# Patient Record
Sex: Female | Born: 1995 | Race: Black or African American | Hispanic: No | Marital: Single | Smoking: Never smoker
Health system: Southern US, Community
[De-identification: ages and names within clinical notes are randomized; demographics above are authoritative.]

## PROBLEM LIST (undated history)

## (undated) HISTORY — PX: WISDOM TOOTH EXTRACTION: SHX21

---
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
## 2017-01-22 ENCOUNTER — Ambulatory Visit: Payer: Medicaid Other | Admitting: Obstetrics and Gynecology

## 2017-05-27 ENCOUNTER — Emergency Department (HOSPITAL_COMMUNITY): Payer: Medicaid Other

## 2017-05-27 ENCOUNTER — Encounter (HOSPITAL_COMMUNITY): Payer: Self-pay

## 2017-05-27 ENCOUNTER — Emergency Department (HOSPITAL_COMMUNITY)
Admission: EM | Admit: 2017-05-27 | Discharge: 2017-05-27 | Disposition: A | Discharge status: Home / Self Care | Payer: Medicaid Other | Attending: Emergency Medicine | Admitting: Emergency Medicine

## 2017-05-27 DIAGNOSIS — R0789 Other chest pain: Secondary | ICD-10-CM | POA: Diagnosis present

## 2017-05-27 LAB — POC URINE PREG, ED: Preg Test, Ur: NEGATIVE

## 2017-05-27 LAB — CBC WITH DIFFERENTIAL/PLATELET
BASOS PCT: 0 %
Basophils Absolute: 0 10*3/uL (ref 0.0–0.1)
Eosinophils Absolute: 0.1 10*3/uL (ref 0.0–0.7)
Eosinophils Relative: 1 %
HEMATOCRIT: 35 % — AB (ref 36.0–46.0)
HEMOGLOBIN: 11.5 g/dL — AB (ref 12.0–15.0)
LYMPHS ABS: 2.2 10*3/uL (ref 0.7–4.0)
Lymphocytes Relative: 18 %
MCH: 30.7 pg (ref 26.0–34.0)
MCHC: 32.9 g/dL (ref 30.0–36.0)
MCV: 93.3 fL (ref 78.0–100.0)
MONOS PCT: 9 %
Monocytes Absolute: 1.1 10*3/uL — ABNORMAL HIGH (ref 0.1–1.0)
NEUTROS ABS: 8.7 10*3/uL — AB (ref 1.7–7.7)
NEUTROS PCT: 72 %
Platelets: 337 10*3/uL (ref 150–400)
RBC: 3.75 MIL/uL — AB (ref 3.87–5.11)
RDW: 12.5 % (ref 11.5–15.5)
WBC: 12.1 10*3/uL — AB (ref 4.0–10.5)

## 2017-05-27 LAB — BASIC METABOLIC PANEL
ANION GAP: 7 (ref 5–15)
BUN: 7 mg/dL (ref 6–20)
CALCIUM: 9.2 mg/dL (ref 8.9–10.3)
CHLORIDE: 104 mmol/L (ref 101–111)
CO2: 27 mmol/L (ref 22–32)
Creatinine, Ser: 0.71 mg/dL (ref 0.44–1.00)
GFR calc non Af Amer: >60 mL/min (ref >60)
Glucose, Bld: 109 mg/dL — ABNORMAL HIGH (ref 65–99)
Potassium: 3.8 mmol/L (ref 3.5–5.1)
SODIUM: 138 mmol/L (ref 135–145)

## 2017-05-27 LAB — D-DIMER, QUANTITATIVE (NOT AT ARMC): D DIMER QUANT: 1.24 ug{FEU}/mL — AB (ref 0.00–0.50)

## 2017-05-27 MED ORDER — IOPAMIDOL (ISOVUE-370) INJECTION 76%
100.0000 mL | Freq: Once | INTRAVENOUS | Status: AC | PRN
  Administered 2017-05-27: 80 mL via INTRAVENOUS

## 2017-05-27 MED ORDER — KETOROLAC TROMETHAMINE 30 MG/ML IJ SOLN
30.0000 mg | Freq: Once | INTRAMUSCULAR | Status: DC
  Filled 2017-05-27: qty 1

## 2017-05-27 MED ORDER — IOPAMIDOL (ISOVUE-370) INJECTION 76%
INTRAVENOUS | Status: AC
  Administered 2017-05-27: 80 mL via INTRAVENOUS
  Filled 2017-05-27: qty 100

## 2017-05-27 MED ORDER — IBUPROFEN 800 MG PO TABS
800.0000 mg | ORAL_TABLET | Freq: Once | ORAL | Status: AC
  Administered 2017-05-27: 800 mg via ORAL
  Filled 2017-05-27: qty 1

## 2017-05-27 NOTE — Discharge Instructions (Signed)
Ibuprofen 600 mg every 6 hours as needed for pain. ° °Return to the emergency department if you develop worsening pain, difficulty breathing, high fever, or other new and concerning symptoms. °

## 2017-05-27 NOTE — ED Notes (Signed)
Bed: WTR6 Expected date:  Expected time:  Means of arrival:  Comments: 

## 2017-05-27 NOTE — ED Provider Notes (Signed)
[facility]-EMERGENCY DEPT Provider Note   CSN: [id number] Arrival date & time: 05/27/17  0018     History   Chief Complaint Chief Complaint  Patient presents with  . Flank Pain    HPI Christine Howe is a 22 y.o. female.  Patient is a 22 year old female with no significant past medical history presenting for evaluation of right rib pain.  This started this evening after she finished work.  She reports pain to her right lateral ribs that is worse when she breathes, moves, or palpates the area.  She denies any productive cough.  She denies any fevers or chills.   The history is provided by the patient.  Chest Pain   This is a new problem. The current episode started 3 to 5 hours ago. The problem occurs constantly. The problem has not changed since onset.The pain is associated with movement, coughing and breathing. The pain is present in the lateral region. The pain is moderate. The quality of the pain is described as sharp. The pain does not radiate. The symptoms are aggravated by deep breathing and certain positions. She has tried nothing for the symptoms.    History reviewed. No pertinent past medical history.  There are no active problems to display for this patient.   Past Surgical History:  Procedure Laterality Date  . WISDOM TOOTH EXTRACTION      OB History    No data available       Home Medications    Prior to Admission medications   Medication Sig Start Date End Date Taking? Authorizing Provider  ibuprofen (ADVIL,MOTRIN) 800 MG tablet Take 1 tablet (800 mg total) by mouth 3 (three) times daily. Patient not taking: Reported on 05/27/2017 11/13/16   Gilda Crease, MD    Family History History reviewed. No pertinent family history.  Social History Social History   Tobacco Use  . Smoking status: Never Smoker  . Smokeless tobacco: Never Used  Substance Use Topics  . Alcohol use: No  . Drug use: No     Allergies   Patient  has no known allergies.   Review of Systems Review of Systems  Cardiovascular: Positive for chest pain.  All other systems reviewed and are negative.    Physical Exam Updated Vital Signs BP 124/90 (BP Location: Right Arm)   Pulse 83   Temp 100.1 F (37.8 C) (Oral)   Resp 20   LMP 05/20/2017   SpO2 100%   Physical Exam  Constitutional: She is oriented to person, place, and time. She appears well-developed and well-nourished. No distress.  HENT:  Head: Normocephalic and atraumatic.  Neck: Normal range of motion. Neck supple.  Cardiovascular: Normal rate and regular rhythm. Exam reveals no gallop and no friction rub.  No murmur heard. There is tenderness to palpation over the right lateral ribs.  This reproduces her symptoms.  Pulmonary/Chest: Effort normal and breath sounds normal. No respiratory distress. She has no wheezes.  Abdominal: Soft. Bowel sounds are normal. She exhibits no distension. There is no tenderness.  Musculoskeletal: Normal range of motion.  Neurological: She is alert and oriented to person, place, and time.  Skin: Skin is warm and dry. She is not diaphoretic.  Nursing note and vitals reviewed.    ED Treatments / Results  Labs (all labs ordered are listed, but only abnormal results are displayed) Labs Reviewed - No data to display  EKG  EKG Interpretation None       Radiology No results  found.  Procedures Procedures (including critical care time)  Medications Ordered in ED Medications  ketorolac (TORADOL) 30 MG/ML injection 30 mg (not administered)     Initial Impression / Assessment and Plan / ED Course  I have reviewed the triage vital signs and the nursing notes.  Pertinent labs & imaging results that were available during my care of the patient were reviewed by me and considered in my medical decision making (see chart for details).  Patient presenting here with complaints of sharp pain in the right side of her chest that began  after finishing work.  Her presentation and exam is most consistent with a musculoskeletal etiology, however she did have an elevated d-dimer.  A CT scan was obtained which reveals no evidence for pulmonary embolism or other abnormality.  I still believe her symptoms are musculoskeletal and will treat as such.  She will be given anti-inflammatories, advised to rest, and follow-up as needed.  Final Clinical Impressions(s) / ED Diagnoses   Final diagnoses:  None    ED Discharge Orders    None       Geoffery Lyonselo, Corinthian Mizrahi, MD 05/27/17 [id number]

## 2017-05-27 NOTE — ED Triage Notes (Signed)
Pt complains of right sided pain from her ribs to her clavicle for 2 hours, no injury

## 2017-11-08 ENCOUNTER — Emergency Department (HOSPITAL_COMMUNITY)
Admission: EM | Admit: 2017-11-08 | Discharge: 2017-11-08 | Disposition: A | Discharge status: Home / Self Care | Payer: Medicaid Other | Attending: Emergency Medicine | Admitting: Emergency Medicine

## 2017-11-08 ENCOUNTER — Encounter (HOSPITAL_COMMUNITY): Payer: Self-pay

## 2017-11-08 ENCOUNTER — Other Ambulatory Visit: Payer: Self-pay

## 2017-11-08 DIAGNOSIS — G43909 Migraine, unspecified, not intractable, without status migrainosus: Secondary | ICD-10-CM | POA: Diagnosis present

## 2017-11-08 DIAGNOSIS — G43809 Other migraine, not intractable, without status migrainosus: Secondary | ICD-10-CM | POA: Insufficient documentation

## 2017-11-08 MED ORDER — ONDANSETRON 4 MG PO TBDP: 4.0000 mg | ORAL_TABLET | Freq: Three times a day (TID) | ORAL | 0 refills | Status: AC | PRN

## 2017-11-08 MED ORDER — DIPHENHYDRAMINE HCL 50 MG/ML IJ SOLN
25.0000 mg | Freq: Once | INTRAMUSCULAR | Status: AC
  Administered 2017-11-08: 25 mg via INTRAVENOUS
  Filled 2017-11-08: qty 1

## 2017-11-08 MED ORDER — PROCHLORPERAZINE EDISYLATE 10 MG/2ML IJ SOLN
10.0000 mg | Freq: Once | INTRAMUSCULAR | Status: AC
  Administered 2017-11-08: 10 mg via INTRAVENOUS
  Filled 2017-11-08: qty 2

## 2017-11-08 MED ORDER — OXYCODONE-ACETAMINOPHEN 5-325 MG PO TABS
1.0000 | ORAL_TABLET | ORAL | Status: DC | PRN
  Administered 2017-11-08: 1 via ORAL
  Filled 2017-11-08: qty 1

## 2017-11-08 MED ORDER — SODIUM CHLORIDE 0.9 % IV BOLUS
1000.0000 mL | Freq: Once | INTRAVENOUS | Status: AC
  Administered 2017-11-08: 1000 mL via INTRAVENOUS

## 2017-11-08 NOTE — ED Triage Notes (Signed)
Pt presents for evaluation of migraine since Thursday. Pt reports unable to sleep, no relief from pain despite excedrin, ibuprofen, and tylenol. Patient reports hx of similar. Denies vision changes. Photosensitivity reported.

## 2017-11-08 NOTE — ED Notes (Signed)
No answer

## 2017-11-08 NOTE — ED Provider Notes (Signed)
[facility] EMERGENCY DEPARTMENT Provider Note   CSN: [id number] Arrival date & time: 11/08/17  1143     History   Chief Complaint Chief Complaint  Patient presents with  . Migraine    HPI Christine Howe is a 22 y.o. female with a past medical history of migraines, who presents to ED for evaluation of migraine for the past 3 days.  States that it started as a headache.  Her migraines usually last about 5 minutes.  She took 1 dose of ibuprofen when the symptoms began.  She reports associated nausea, photophobia, phonophobia.  Denies any head injuries, numbness in arms or legs, vision changes, neck pain, fever, abdominal pain, changes to gait.  HPI  History reviewed. No pertinent past medical history.  There are no active problems to display for this patient.   Past Surgical History:  Procedure Laterality Date  . WISDOM TOOTH EXTRACTION       OB History   None      Home Medications    Prior to Admission medications   Medication Sig Start Date End Date Taking? Authorizing Provider  ibuprofen (ADVIL,MOTRIN) 800 MG tablet Take 1 tablet (800 mg total) by mouth 3 (three) times daily. Patient not taking: Reported on 05/27/2017 11/13/16   Gilda Crease, MD  ondansetron (ZOFRAN ODT) 4 MG disintegrating tablet Take 1 tablet (4 mg total) by mouth every 8 (eight) hours as needed for nausea or vomiting. 11/08/17   Dietrich Pates, PA-C    Family History No family history on file.  Social History Social History   Tobacco Use  . Smoking status: Never Smoker  . Smokeless tobacco: Never Used  Substance Use Topics  . Alcohol use: No  . Drug use: No     Allergies   Patient has no known allergies.   Review of Systems Review of Systems  Constitutional: Negative for appetite change, chills and fever.  HENT: Negative for ear pain, rhinorrhea, sneezing and sore throat.   Eyes: Negative for photophobia and visual disturbance.  Respiratory: Negative for  cough, chest tightness, shortness of breath and wheezing.   Cardiovascular: Negative for chest pain and palpitations.  Gastrointestinal: Positive for nausea. Negative for abdominal pain, blood in stool, constipation, diarrhea and vomiting.  Genitourinary: Negative for dysuria, hematuria and urgency.  Musculoskeletal: Negative for myalgias.  Skin: Negative for rash.  Neurological: Positive for headaches. Negative for dizziness, weakness and light-headedness.     Physical Exam Updated Vital Signs BP 123/76 (BP Location: Right Arm)   Pulse 73   Temp 99.1 F (37.3 C) (Oral)   Resp 16   LMP 11/04/2017 (Approximate)   SpO2 99%   Physical Exam  Constitutional: She is oriented to person, place, and time. She appears well-developed and well-nourished. No distress.  HENT:  Head: Normocephalic and atraumatic.  Nose: Nose normal.  Eyes: Pupils are equal, round, and reactive to light. Conjunctivae and EOM are normal. Right eye exhibits no discharge. Left eye exhibits no discharge. No scleral icterus.  Neck: Normal range of motion. Neck supple.  Cardiovascular: Normal rate, regular rhythm, normal heart sounds and intact distal pulses. Exam reveals no gallop and no friction rub.  No murmur heard. Pulmonary/Chest: Effort normal and breath sounds normal. No respiratory distress.  Abdominal: Soft. Bowel sounds are normal. She exhibits no distension. There is no tenderness. There is no guarding.  Musculoskeletal: Normal range of motion. She exhibits no edema.  Neurological: She is alert and oriented to person, place, and  time. No cranial nerve deficit or sensory deficit. She exhibits normal muscle tone. Coordination normal.  Pupils reactive. No facial asymmetry noted. Cranial nerves appear grossly intact. Sensation intact to light touch on face, BUE and BLE. Strength 5/5 in BUE and BLE. Normal finger to nose coordination bilaterally.  Skin: Skin is warm and dry. No rash noted.  Psychiatric: She has a  normal mood and affect.  Nursing note and vitals reviewed.    ED Treatments / Results  Labs (all labs ordered are listed, but only abnormal results are displayed) Labs Reviewed - No data to display  EKG None  Radiology No results found.  Procedures Procedures (including critical care time)  Medications Ordered in ED Medications  oxyCODONE-acetaminophen (PERCOCET/ROXICET) 5-325 MG per tablet 1 tablet (1 tablet Oral Given 11/08/17 1215)  sodium chloride 0.9 % bolus 1,000 mL (0 mLs Intravenous Stopped 11/08/17 1429)  prochlorperazine (COMPAZINE) injection 10 mg (10 mg Intravenous Given 11/08/17 1323)  diphenhydrAMINE (BENADRYL) injection 25 mg (25 mg Intravenous Given 11/08/17 1323)     Initial Impression / Assessment and Plan / ED Course  I have reviewed the triage vital signs and the nursing notes.  Pertinent labs & imaging results that were available during my care of the patient were reviewed by me and considered in my medical decision making (see chart for details).  Clinical Course as of Nov 09 1442  Sun Nov 08, 2017  1442 Patient reports complete resolution of her symptoms with migraine cocktail given.   [HK]    Clinical Course User Index [HK] Dietrich PatesKhatri, Teandre Hamre, PA-C    Patient presents to ED for evaluation of migraine for the past 3 days.  Reports history of similar symptoms that usually resolve without medications or at the very least over-the-counter medications.  She has tried ibuprofen with no improvement in her symptoms. There are no headache characteristics that are lateralizing or concerning for increased ICP, infectious or vascular cause of her symptoms.  No deficits on neurological examination noted.  Symptoms appear consistent with a migraine headache.  Patient reports complete resolution of her symptoms with fluids, Benadryl and Compazine given here in the ED.  Advised to follow-up with PCP and to return to ED for any severe worsening symptoms.  Portions of this  note were generated with Scientist, clinical (histocompatibility and immunogenetics)Dragon dictation software. Dictation errors may occur despite best attempts at proofreading.  Final Clinical Impressions(s) / ED Diagnoses   Final diagnoses:  Other migraine without status migrainosus, not intractable    ED Discharge Orders        Ordered    ondansetron (ZOFRAN ODT) 4 MG disintegrating tablet  Every 8 hours PRN     11/08/17 1443       Dietrich PatesKhatri, Almee Pelphrey, PA-C 11/08/17 1445    Bethann BerkshireZammit, Joseph, MD 11/08/17 1620

## 2018-01-27 ENCOUNTER — Other Ambulatory Visit: Payer: Self-pay

## 2018-01-27 ENCOUNTER — Emergency Department (HOSPITAL_COMMUNITY): Payer: Self-pay

## 2018-01-27 ENCOUNTER — Encounter (HOSPITAL_COMMUNITY): Payer: Self-pay

## 2018-01-27 ENCOUNTER — Emergency Department (HOSPITAL_COMMUNITY)
Admission: EM | Admit: 2018-01-27 | Discharge: 2018-01-28 | Disposition: A | Discharge status: Home / Self Care | Payer: Self-pay | Attending: Emergency Medicine | Admitting: Emergency Medicine

## 2018-01-27 DIAGNOSIS — N939 Abnormal uterine and vaginal bleeding, unspecified: Secondary | ICD-10-CM | POA: Insufficient documentation

## 2018-01-27 LAB — CBC WITH DIFFERENTIAL/PLATELET
BASOS ABS: 0 10*3/uL (ref 0.0–0.1)
BASOS PCT: 0 %
EOS ABS: 0.2 10*3/uL (ref 0.0–0.7)
EOS PCT: 3 %
HCT: 39.2 % (ref 36.0–46.0)
Hemoglobin: 12.6 g/dL (ref 12.0–15.0)
Lymphocytes Relative: 44 %
Lymphs Abs: 3.3 10*3/uL (ref 0.7–4.0)
MCH: 30.2 pg (ref 26.0–34.0)
MCHC: 32.1 g/dL (ref 30.0–36.0)
MCV: 94 fL (ref 78.0–100.0)
MONO ABS: 0.4 10*3/uL (ref 0.1–1.0)
MONOS PCT: 6 %
Neutro Abs: 3.6 10*3/uL (ref 1.7–7.7)
Neutrophils Relative %: 47 %
PLATELETS: 380 10*3/uL (ref 150–400)
RBC: 4.17 MIL/uL (ref 3.87–5.11)
RDW: 12.6 % (ref 11.5–15.5)
WBC: 7.5 10*3/uL (ref 4.0–10.5)

## 2018-01-27 LAB — BASIC METABOLIC PANEL
ANION GAP: 9 (ref 5–15)
BUN: 9 mg/dL (ref 6–20)
CALCIUM: 9.7 mg/dL (ref 8.9–10.3)
CO2: 25 mmol/L (ref 22–32)
CREATININE: 0.87 mg/dL (ref 0.44–1.00)
Chloride: 109 mmol/L (ref 98–111)
Glucose, Bld: 90 mg/dL (ref 70–99)
Potassium: 4.1 mmol/L (ref 3.5–5.1)
Sodium: 143 mmol/L (ref 135–145)

## 2018-01-27 LAB — I-STAT BETA HCG BLOOD, ED (MC, WL, AP ONLY): I-stat hCG, quantitative: <5 m[IU]/mL (ref <5)

## 2018-01-27 MED ORDER — ONDANSETRON HCL 4 MG/2ML IJ SOLN
4.0000 mg | Freq: Once | INTRAMUSCULAR | Status: AC
  Administered 2018-01-27: 4 mg via INTRAVENOUS
  Filled 2018-01-27: qty 2

## 2018-01-27 MED ORDER — MORPHINE SULFATE (PF) 4 MG/ML IV SOLN
4.0000 mg | Freq: Once | INTRAVENOUS | Status: AC
  Administered 2018-01-27: 4 mg via INTRAVENOUS
  Filled 2018-01-27: qty 1

## 2018-01-27 MED ORDER — SODIUM CHLORIDE 0.9 % IV BOLUS
1000.0000 mL | Freq: Once | INTRAVENOUS | Status: AC
  Administered 2018-01-27: 1000 mL via INTRAVENOUS

## 2018-01-27 MED ORDER — AZITHROMYCIN 250 MG PO TABS
1000.0000 mg | ORAL_TABLET | Freq: Once | ORAL | Status: AC
  Administered 2018-01-28: 1000 mg via ORAL
  Filled 2018-01-27: qty 4

## 2018-01-27 MED ORDER — CEFTRIAXONE SODIUM 250 MG IJ SOLR
250.0000 mg | Freq: Once | INTRAMUSCULAR | Status: AC
  Administered 2018-01-28: 250 mg via INTRAMUSCULAR
  Filled 2018-01-27: qty 250

## 2018-01-27 NOTE — Discharge Instructions (Addendum)
Evaluated today for vaginal bleeding and STD screening.  Your lab work was negative for any acute findings.  Your CT scan did show that you had cyst on your ovaries.  I will have you follow-up with OB/GYN for your recurrent vaginal bleeding.  Please follow-up in the next 2 days.    You have been tested for gonorrhea, chlamydia, HIV, syphilis.  Your results was a 24-48 hrs. to come back.  You have been treated prophylactically for gonorrhea and chlamydia with azithromycin and Rocephin while in the emergency department.  If your results are positive you need to require sexual partners that they will need to be tested and treated as well.  Please do not resume sexual intercourse until 1 week after your partner has been treated.    Please return to the ED with any new or worsening symptoms such as:  Get help right away if: Your pain does not go away as soon as your doctor says it should. You cannot stop throwing up. Your pain is only in areas of your belly, such as the right side or the left lower part of the belly. You have bloody or black poop, or poop that looks like tar. You have very bad pain, cramping, or bloating in your belly. You have signs of not having enough fluid or water in your body (dehydration), such as: Dark pee, very little pee, or no pee. Cracked lips. Dry mouth. Sunken eyes. Sleepiness. Weakness.

## 2018-01-27 NOTE — ED Notes (Signed)
US at bedside

## 2018-01-27 NOTE — ED Provider Notes (Signed)
[facility]-EMERGENCY DEPT Provider Note   CSN: [id number] Arrival date & time: 01/27/18  1808   History   Chief Complaint Chief Complaint  Patient presents with  . Vaginal Bleeding    HPI Christine Howe is a 22 y.o. female no significant medical history who presents for evaluation of vaginal bleeding.  Patient states she had her normal cycle which last 7 days and it had tapered off, however 1 day later she started with her cycle again.  Last 2 days she has had spotting. Admits to intermittent lower abdominal cramping.  States she is sexually active and uses condoms as protection. Denies fever, chills, lightheadedness, dizziness, syncope, diarrhea, constipation, vaginal discharge, dysuria, hematuria. States she would like STD testing during her visit today.  HPI  History reviewed. No pertinent past medical history.  There are no active problems to display for this patient.   Past Surgical History:  Procedure Laterality Date  . WISDOM TOOTH EXTRACTION       OB History   None      Home Medications    Prior to Admission medications   Medication Sig Start Date End Date Taking? Authorizing Provider  ibuprofen (ADVIL,MOTRIN) 800 MG tablet Take 1 tablet (800 mg total) by mouth 3 (three) times daily. Patient not taking: Reported on 05/27/2017 11/13/16   Gilda Crease, MD  ondansetron (ZOFRAN ODT) 4 MG disintegrating tablet Take 1 tablet (4 mg total) by mouth every 8 (eight) hours as needed for nausea or vomiting. Patient not taking: Reported on 01/27/2018 11/08/17   Dietrich Pates, PA-C    Family History No family history on file.  Social History Social History   Tobacco Use  . Smoking status: Never Smoker  . Smokeless tobacco: Never Used  Substance Use Topics  . Alcohol use: No  . Drug use: No     Allergies   Patient has no known allergies.   Review of Systems Review of Systems  Constitutional: Negative for activity change, chills,  diaphoresis, fatigue and fever.  Gastrointestinal: Positive for abdominal pain and nausea. Negative for blood in stool, constipation, diarrhea and vomiting.  Genitourinary: Positive for menstrual problem and vaginal bleeding. Negative for decreased urine volume, difficulty urinating, dysuria, flank pain, frequency, genital sores, hematuria, pelvic pain, urgency, vaginal discharge and vaginal pain.  Musculoskeletal: Negative for back pain.  Skin: Negative.   Neurological: Negative for dizziness, weakness and light-headedness.     Physical Exam Updated Vital Signs BP 128/73 (BP Location: Left Arm)   Pulse 79   Temp 98.8 F (37.1 C) (Oral)   Resp 16   Ht 5\' 3"  (1.6 m)   Wt 62.8 kg   LMP 01/27/2018   SpO2 100%   BMI 24.52 kg/m   Physical Exam  Constitutional: She appears well-developed and well-nourished. No distress.  HENT:  Head: Normocephalic and atraumatic.  Mouth/Throat: Oropharynx is clear and moist.  Eyes: Pupils are equal, round, and reactive to light. Conjunctivae are normal.  Neck: Normal range of motion. Neck supple.  Cardiovascular: Normal rate, regular rhythm and intact distal pulses. Exam reveals no friction rub.  No murmur heard. Pulmonary/Chest: Effort normal and breath sounds normal. No stridor. No respiratory distress. She has no wheezes. She has no rales. She exhibits no tenderness.  Abdominal: Soft. Bowel sounds are normal. She exhibits no distension, no fluid wave, no ascites and no mass. There is no hepatosplenomegaly. There is tenderness in the suprapubic area. There is no rigidity, no rebound, no guarding, no  CVA tenderness, no tenderness at McBurney's point and negative Murphy's sign.  Genitourinary:  Genitourinary Comments: Normal appearing external female genitalia without rashes or lesions, normal vaginal epithelium. Normal appearing cervix without discharge or petechiae. Cervical os is closed. There is no active bleeding noted at the os. NoOdor. Bimanual:  No CMTnontender.  No palpable adnexal masses or tenderness. Uterus midline and not fixed.  No cystocele or rectocele noted. No pelvic lymphadenopathy noted. Wet prep was obtained.  Cultures for gonorrhea and chlamydia collected. Exam performed with chaperone in room.  Musculoskeletal: Normal range of motion.  Neurological: She is alert.  Skin: Skin is warm and dry. She is not diaphoretic.  Psychiatric: She has a normal mood and affect.  Nursing note and vitals reviewed.    ED Treatments / Results  Labs (all labs ordered are listed, but only abnormal results are displayed) Labs Reviewed  CBC WITH DIFFERENTIAL/PLATELET  BASIC METABOLIC PANEL  HIV ANTIBODY (ROUTINE TESTING W REFLEX)  RPR  I-STAT BETA HCG BLOOD, ED ([facility], [facility], [facility] ONLY)  GC/CHLAMYDIA PROBE AMP ([facility]) NOT AT [facility]    EKG None  Radiology US Transvaginal Non-ob  Result Date: 01/27/2018 CLINICAL DATA:  Initial evaluation for old acute pelvic pain with vaginal bleeding for 2 weeks. EXAM: TRANSABDOMINAL AND TRANSVAGINAL ULTRASOUND OF PELVIS TECHNIQUE: Both transabdominal and transvaginal ultrasound examinations of the pelvis were performed. Transabdominal technique was performed for global imaging of the pelvis including uterus, ovaries, adnexal regions, and pelvic cul-de-sac. It was necessary to proceed with endovaginal exam following the transabdominal exam to visualize the uterus, endometrium, and ovaries. COMPARISON:  None FINDINGS: Uterus Measurements: 7.6 x 3.3 x 4.5 cm. No fibroids or other mass visualized. Endometrium Thickness: 11 mm.  No focal abnormality visualized. Right ovary Measurements: 0.3 x 2.2 x 2.6 cm. Normal appearance/no adnexal mass. Left ovary Measurements: 3.6 x 2.3 x 2.7 cm. 1.7 x 1.4 x 1.7 cm simple cyst, most consistent with a normal physiologic follicular cyst/dominant follicle. Other findings Small volume free physiologic fluid within the pelvis. IMPRESSION: 1. 1.7 cm simple left ovarian cyst, most  consistent with a normal physiologic follicular cyst/dominant follicle. Associated small volume free physiologic fluid within the pelvis. 2. Otherwise unremarkable and normal pelvic ultrasound. Electronically Signed   By: Rise Mu M.D.   On: 01/27/2018 23:19   US Pelvis Complete  Result Date: 01/27/2018 CLINICAL DATA:  Initial evaluation for old acute pelvic pain with vaginal bleeding for 2 weeks. EXAM: TRANSABDOMINAL AND TRANSVAGINAL ULTRASOUND OF PELVIS TECHNIQUE: Both transabdominal and transvaginal ultrasound examinations of the pelvis were performed. Transabdominal technique was performed for global imaging of the pelvis including uterus, ovaries, adnexal regions, and pelvic cul-de-sac. It was necessary to proceed with endovaginal exam following the transabdominal exam to visualize the uterus, endometrium, and ovaries. COMPARISON:  None FINDINGS: Uterus Measurements: 7.6 x 3.3 x 4.5 cm. No fibroids or other mass visualized. Endometrium Thickness: 11 mm.  No focal abnormality visualized. Right ovary Measurements: 0.3 x 2.2 x 2.6 cm. Normal appearance/no adnexal mass. Left ovary Measurements: 3.6 x 2.3 x 2.7 cm. 1.7 x 1.4 x 1.7 cm simple cyst, most consistent with a normal physiologic follicular cyst/dominant follicle. Other findings Small volume free physiologic fluid within the pelvis. IMPRESSION: 1. 1.7 cm simple left ovarian cyst, most consistent with a normal physiologic follicular cyst/dominant follicle. Associated small volume free physiologic fluid within the pelvis. 2. Otherwise unremarkable and normal pelvic ultrasound. Electronically Signed   By: Rise Mu M.D.   On: 01/27/2018  23:19    Procedures Procedures (including critical care time)  Medications Ordered in ED Medications  azithromycin (ZITHROMAX) tablet 1,000 mg (has no administration in time range)  cefTRIAXone (ROCEPHIN) injection 250 mg (has no administration in time range)  sodium chloride 0.9 % bolus  1,000 mL (1,000 mLs Intravenous New Bag/Given 01/27/18 2226)  morphine 4 MG/ML injection 4 mg (4 mg Intravenous Given 01/27/18 2226)  ondansetron (ZOFRAN) injection 4 mg (4 mg Intravenous Given 01/27/18 2226)     Initial Impression / Assessment and Plan / ED Course  I have reviewed the triage vital signs and the nursing notes as well as past medical history.  Pertinent labs & imaging results that were available during my care of the patient were reviewed by me and considered in my medical decision making (see chart for details).  22 year old otherwise healthy female presents for 2 weeks of intermittent vaginal bleeding.  Mild suprapubic tenderness to palpation.  Afebrile, nonseptic, non-ill-appearing.  No rebound or guarding on exam.  Will obtain labs, ultrasound, pelvic examand reassess. Patient is also requesting STD screening at this time.  CBC without leukocytosis, hemoglobin 12.6.  hCG negative. US with a left ovarian cyst.  No cervical motion tenderness on exam.  No active bleeding from cervix. Patient does not meet the SIRS or Sepsis criteria.  On repeat exam patient does not have a surgical abdomin and there are no peritoneal signs.  No indication of appendicitis, bowel obstruction, bowel perforation, cholecystitis, diverticulitis, PID or ectopic pregnancy.    Pt understands that they have GC/Chlamydia cultures pending and that they will need to inform all sexual partners if results return positive. Pt has been treated prophylactically with azithromycin and Rocephin due to pts history, pelvic exam. Pt not concerning for PID because hemodynamically stable and no cervical motion tenderness on pelvic exam. Patient to be discharged with instructions to follow up with OBGYN. Discussed importance of using protection when sexually active. Strict return precautions given. Patient expresses understanding and agrees with plan.  Discussed to follow-up with OB/GYN.  She is agreeable for follow-up.   Final  Clinical Impressions(s) / ED Diagnoses   Final diagnoses:  Abnormal uterine bleeding (AUB)    ED Discharge Orders    None       Scottie Metayer A, PA-C 01/28/18 0000    Terrilee FilesButler, Michael C, MD 01/28/18 1209

## 2018-01-27 NOTE — ED Triage Notes (Signed)
Pt states she has had vaginal bleeding x 14 days. Pt states she has been going through 5 per day. Pt states however today, she has only been spotting.

## 2018-01-28 LAB — GC/CHLAMYDIA PROBE AMP (~~LOC~~) NOT AT ARMC
CHLAMYDIA, DNA PROBE: POSITIVE — AB
NEISSERIA GONORRHEA: NEGATIVE

## 2018-01-28 LAB — RPR: RPR: NONREACTIVE

## 2018-01-28 LAB — HIV ANTIBODY (ROUTINE TESTING W REFLEX): HIV Screen 4th Generation wRfx: NONREACTIVE

## 2018-01-28 MED ORDER — LIDOCAINE HCL 1 % IJ SOLN
INTRAMUSCULAR | Status: AC
  Administered 2018-01-28: 0.9 mL
  Filled 2018-01-28: qty 20

## 2018-02-15 ENCOUNTER — Emergency Department (HOSPITAL_COMMUNITY)
Admission: EM | Admit: 2018-02-15 | Discharge: 2018-02-16 | Disposition: A | Discharge status: Home / Self Care | Payer: Self-pay | Attending: Emergency Medicine | Admitting: Emergency Medicine

## 2018-02-15 ENCOUNTER — Emergency Department (HOSPITAL_COMMUNITY): Payer: Self-pay

## 2018-02-15 ENCOUNTER — Encounter (HOSPITAL_COMMUNITY): Payer: Self-pay | Admitting: *Deleted

## 2018-02-15 ENCOUNTER — Other Ambulatory Visit: Payer: Self-pay

## 2018-02-15 DIAGNOSIS — R0600 Dyspnea, unspecified: Secondary | ICD-10-CM | POA: Insufficient documentation

## 2018-02-15 DIAGNOSIS — R0789 Other chest pain: Secondary | ICD-10-CM | POA: Insufficient documentation

## 2018-02-15 DIAGNOSIS — F1210 Cannabis abuse, uncomplicated: Secondary | ICD-10-CM | POA: Insufficient documentation

## 2018-02-15 NOTE — ED Provider Notes (Signed)
[facility]-EMERGENCY DEPT Provider Note   CSN: [id number] Arrival date & time: 02/15/18  2142     History   Chief Complaint Chest pain and shortness of breath  HPI Christine Howe is a 22 y.o. female.  HPI Patient presents to the emergency room for evaluation of chest pain and shortness of breath.  Patient states she felt well during the day but suddenly started to feel short of breath while she was smoking marijuana.  Patient also started to feel jittery.  It hurts for her to take a deep breath.  Patient denies any trouble with fevers or chills.  No coughing.  No vomiting or diarrhea.  She does not have any history of heart or lung disease.  History reviewed. No pertinent past medical history.  There are no active problems to display for this patient.   Past Surgical History:  Procedure Laterality Date  . WISDOM TOOTH EXTRACTION       OB History   None      Home Medications    Prior to Admission medications   Medication Sig Start Date End Date Taking? Authorizing Provider  acetaminophen (TYLENOL) 500 MG tablet Take 1,000 mg by mouth daily as needed for moderate pain.   Yes [provider]  ibuprofen (ADVIL,MOTRIN) 800 MG tablet Take 1 tablet (800 mg total) by mouth 3 (three) times daily. Patient not taking: Reported on 02/15/2018 11/13/16   Gilda Crease, MD  ondansetron (ZOFRAN ODT) 4 MG disintegrating tablet Take 1 tablet (4 mg total) by mouth every 8 (eight) hours as needed for nausea or vomiting. Patient not taking: Reported on 02/15/2018 11/08/17   Dietrich Pates, PA-C    Family History No family history on file.  Social History Social History   Tobacco Use  . Smoking status: Never Smoker  . Smokeless tobacco: Never Used  Substance Use Topics  . Alcohol use: No  . Drug use: No     Allergies   Patient has no known allergies.   Review of Systems Review of Systems  All other systems reviewed and are  negative.    Physical Exam Updated Vital Signs LMP 02/15/2018   Physical Exam  Constitutional: She appears well-developed and well-nourished. No distress.  HENT:  Head: Normocephalic and atraumatic.  Right Ear: External ear normal.  Left Ear: External ear normal.  Eyes: Conjunctivae are normal. Right eye exhibits no discharge. Left eye exhibits no discharge. No scleral icterus.  Neck: Neck supple. No tracheal deviation present.  Cardiovascular: Normal rate, regular rhythm and intact distal pulses.  Pulmonary/Chest: Effort normal and breath sounds normal. No stridor. No respiratory distress. She has no wheezes. She has no rales.  Abdominal: Soft. Bowel sounds are normal. She exhibits no distension. There is no tenderness. There is no rebound and no guarding.  Musculoskeletal: She exhibits no edema or tenderness.  Neurological: She is alert. She has normal strength. No cranial nerve deficit (no facial droop, extraocular movements intact, no slurred speech) or sensory deficit. She exhibits normal muscle tone. She displays no seizure activity. Coordination normal.  Skin: Skin is warm and dry. No rash noted.  Psychiatric: Her affect is blunt.  Nursing note and vitals reviewed.    ED Treatments / Results  Labs (all labs ordered are listed, but only abnormal results are displayed) Labs Reviewed - No data to display  EKG EKG Interpretation  Date/Time:  Monday February 15 2018 22:55:30 EDT Ventricular Rate:  82 PR Interval:    QRS  Duration: 82 QT Interval:  361 QTC Calculation: 422 R Axis:   73 Text Interpretation:  Sinus rhythm No significant change since last tracing Confirmed by Linwood Dibbles [phone]) on 02/15/2018 11:04:38 PM   Radiology Dg Chest 2 View  Result Date: 02/15/2018 CLINICAL DATA:  Dyspnea while smoking marijuana 1 hour ago. EXAM: CHEST - 2 VIEW COMPARISON:  CT 05/27/2017 and CXR 05/27/2017 FINDINGS: The heart size and mediastinal contours are within normal limits.  Both lungs are clear. The visualized skeletal structures are unremarkable. IMPRESSION: No active cardiopulmonary disease. Electronically Signed   By: Tollie Eth M.D.   On: 02/15/2018 22:57    Procedures Procedures (including critical care time)  Medications Ordered in ED Medications - No data to display   Initial Impression / Assessment and Plan / ED Course  I have reviewed the triage vital signs and the nursing notes.  Pertinent labs & imaging results that were available during my care of the patient were reviewed by me and considered in my medical decision making (see chart for details).   X-ray and EKG unremarkable.  Patient was monitored in the emergency room.  Symptoms have improved  I suspect her symptoms were related to the marijuana use.  No signs of acute cardiac event.  No signs of pneumonia or pneumothorax.  At this time there does not appear to be any evidence of an acute emergency medical condition and the patient appears stable for discharge with appropriate outpatient follow up.  Final Clinical Impressions(s) / ED Diagnoses   Final diagnoses:  Dyspnea, unspecified type    ED Discharge Orders    None       Linwood Dibbles, MD 02/15/18 2319

## 2018-02-15 NOTE — ED Triage Notes (Signed)
Pt arrives to ED with c/o shortness of breath that started while smoking marijuana 1 hour ago. Also has felt jittery. Lungs sounds clear throughout.

## 2018-02-15 NOTE — Discharge Instructions (Addendum)
Return to the emergency room as needed for worsening symptoms

## 2018-02-17 ENCOUNTER — Encounter: Payer: Medicaid Other | Admitting: Obstetrics and Gynecology

## 2018-02-17 ENCOUNTER — Encounter: Payer: Self-pay | Admitting: Obstetrics and Gynecology

## 2018-03-29 ENCOUNTER — Encounter: Payer: Self-pay | Admitting: *Deleted

## 2018-03-29 ENCOUNTER — Encounter: Payer: Self-pay | Admitting: Obstetrics & Gynecology

## 2018-03-29 ENCOUNTER — Ambulatory Visit: Payer: Medicaid Other | Admitting: Obstetrics & Gynecology

## 2018-03-29 ENCOUNTER — Other Ambulatory Visit (HOSPITAL_COMMUNITY)
Admission: RE | Admit: 2018-03-29 | Discharge: 2018-03-29 | Disposition: A | Discharge status: Home / Self Care | Payer: Medicaid Other | Source: Ambulatory Visit | Attending: Obstetrics & Gynecology | Admitting: Obstetrics & Gynecology

## 2018-03-29 VITALS — BP 119/76 | HR 72 | Ht 63.0 in | Wt 136.0 lb

## 2018-03-29 DIAGNOSIS — A749 Chlamydial infection, unspecified: Secondary | ICD-10-CM | POA: Insufficient documentation

## 2018-03-29 DIAGNOSIS — Z3202 Encounter for pregnancy test, result negative: Secondary | ICD-10-CM

## 2018-03-29 DIAGNOSIS — Z3049 Encounter for surveillance of other contraceptives: Secondary | ICD-10-CM | POA: Diagnosis not present

## 2018-03-29 DIAGNOSIS — Z8619 Personal history of other infectious and parasitic diseases: Secondary | ICD-10-CM | POA: Insufficient documentation

## 2018-03-29 DIAGNOSIS — Z30017 Encounter for initial prescription of implantable subdermal contraceptive: Secondary | ICD-10-CM | POA: Insufficient documentation

## 2018-03-29 LAB — POCT PREGNANCY, URINE: Preg Test, Ur: NEGATIVE

## 2018-03-29 MED ORDER — ETONOGESTREL 68 MG ~~LOC~~ IMPL
68.0000 mg | DRUG_IMPLANT | Freq: Once | SUBCUTANEOUS | Status: AC
  Administered 2018-03-29: 68 mg via SUBCUTANEOUS

## 2018-03-29 NOTE — Progress Notes (Signed)
GYNECOLOGY OFFICE PROCEDURE NOTE  Christine Howe is a 22 y.o. G0P0000 here for Nexplanon insertion. No other gynecologic concerns.  Nexplanon Insertion Procedure Patient identified, informed consent performed, consent signed.   Patient does understand that irregular bleeding is a very common side effect of this medication. She was advised to have backup contraception for one week after placement. Pregnancy test in clinic today was negative.  Appropriate time out taken.  Patient's rightt arm was prepped and draped in the usual sterile fashion. The ruler used to measure and mark insertion area.  Patient was prepped with alcohol swab and then injected with 3 ml of 1% lidocaine.  She was prepped with betadine, Nexplanon removed from packaging,  Device confirmed in needle, then inserted full length of needle and withdrawn per handbook instructions. Nexplanon was able to palpated in the patient's arm; patient palpated the insert herself. There was minimal blood loss.  Patient insertion site covered with gauze and a pressure bandage to reduce any bruising.  The patient tolerated the procedure well and was given post procedure instructions. Advised back-up contraception for one week  Adam PhenixArnold,  G, MD 03/29/2018        Adam PhenixArnold,  G, MD Attending Obstetrician & Gynecologist, [facility]  03/29/2018

## 2018-03-29 NOTE — Patient Instructions (Signed)
Etonogestrel implant What is this medicine? ETONOGESTREL (et oh noe JES trel) is a contraceptive (birth control) device. It is used to prevent pregnancy. It can be used for up to 3 years. This medicine may be used for other purposes; ask your health care provider or pharmacist if you have questions. COMMON BRAND NAME(S): Implanon, Nexplanon What should I tell my health care provider before I take this medicine? They need to know if you have any of these conditions: -abnormal vaginal bleeding -blood vessel disease or blood clots -cancer of the breast, cervix, or liver -depression -diabetes -gallbladder disease -headaches -heart disease or recent heart attack -high blood pressure -high cholesterol -kidney disease -liver disease -renal disease -seizures -tobacco smoker -an unusual or allergic reaction to etonogestrel, other hormones, anesthetics or antiseptics, medicines, foods, dyes, or preservatives -pregnant or trying to get pregnant -breast-feeding How should I use this medicine? This device is inserted just under the skin on the inner side of your upper arm by a health care professional. Talk to your pediatrician regarding the use of this medicine in children. Special care may be needed. Overdosage: If you think you have taken too much of this medicine contact a poison control center or emergency room at once. NOTE: This medicine is only for you. Do not share this medicine with others. What if I miss a dose? This does not apply. What may interact with this medicine? Do not take this medicine with any of the following medications: -amprenavir -bosentan -fosamprenavir This medicine may also interact with the following medications: -barbiturate medicines for inducing sleep or treating seizures -certain medicines for fungal infections like ketoconazole and itraconazole -grapefruit juice -griseofulvin -medicines to treat seizures like carbamazepine, felbamate, oxcarbazepine,  phenytoin, topiramate -modafinil -phenylbutazone -rifampin -rufinamide -some medicines to treat HIV infection like atazanavir, indinavir, lopinavir, nelfinavir, tipranavir, ritonavir -St. John's wort This list may not describe all possible interactions. Give your health care provider a list of all the medicines, herbs, non-prescription drugs, or dietary supplements you use. Also tell them if you smoke, drink alcohol, or use illegal drugs. Some items may interact with your medicine. What should I watch for while using this medicine? This product does not protect you against HIV infection (AIDS) or other sexually transmitted diseases. You should be able to feel the implant by pressing your fingertips over the skin where it was inserted. Contact your doctor if you cannot feel the implant, and use a non-hormonal birth control method (such as condoms) until your doctor confirms that the implant is in place. If you feel that the implant may have broken or become bent while in your arm, contact your healthcare provider. What side effects may I notice from receiving this medicine? Side effects that you should report to your doctor or health care professional as soon as possible: -allergic reactions like skin rash, itching or hives, swelling of the face, lips, or tongue -breast lumps -changes in emotions or moods -depressed mood -heavy or prolonged menstrual bleeding -pain, irritation, swelling, or bruising at the insertion site -scar at site of insertion -signs of infection at the insertion site such as fever, and skin redness, pain or discharge -signs of pregnancy -signs and symptoms of a blood clot such as breathing problems; changes in vision; chest pain; severe, sudden headache; pain, swelling, warmth in the leg; trouble speaking; sudden numbness or weakness of the face, arm or leg -signs and symptoms of liver injury like dark yellow or brown urine; general ill feeling or flu-like symptoms;  light-colored   stools; loss of appetite; nausea; right upper belly pain; unusually weak or tired; yellowing of the eyes or skin -unusual vaginal bleeding, discharge -signs and symptoms of a stroke like changes in vision; confusion; trouble speaking or understanding; severe headaches; sudden numbness or weakness of the face, arm or leg; trouble walking; dizziness; loss of balance or coordination Side effects that usually do not require medical attention (report to your doctor or health care professional if they continue or are bothersome): -acne -back pain -breast pain -changes in weight -dizziness -general ill feeling or flu-like symptoms -headache -irregular menstrual bleeding -nausea -sore throat -vaginal irritation or inflammation This list may not describe all possible side effects. Call your doctor for medical advice about side effects. You may report side effects to FDA at 1-800-FDA-1088. Where should I keep my medicine? This drug is given in a hospital or clinic and will not be stored at home. NOTE: This sheet is a summary. It may not cover all possible information. If you have questions about this medicine, talk to your doctor, pharmacist, or health care provider.  2018 Elsevier/Gold Standard (2015-11-15 11:19:22)  

## 2018-03-29 NOTE — Progress Notes (Signed)
Last unprotected intercourse 2 days ago.  Addendum: 4:14 - noted elevated phq9 while patient in office. Offered to see our behavioural health clinician- she declined

## 2018-03-29 NOTE — Progress Notes (Signed)
Patient ID: Christine Howe, female   DOB: Aug 05, 1995, 22 y.o.   MRN: [id number]  Chief Complaint  Patient presents with  . Ovarian Cyst  . nexplanon    HPI Christine Howe is a 22 y.o. female.  G0P0000 Patient's last menstrual period was 03/21/2018. She is still bleeding since LMP, has intercourse 2 days ago. She wants to have Nexplanon inserted. [place] was done 9/18 and ovarian follicle cyst was seen. S/p treatment for chlamydia and TOC is indicated today. HPI  History reviewed. No pertinent past medical history.  Past Surgical History:  Procedure Laterality Date  . WISDOM TOOTH EXTRACTION      History reviewed. No pertinent family history.  Social History Social History   Tobacco Use  . Smoking status: Never Smoker  . Smokeless tobacco: Never Used  Substance Use Topics  . Alcohol use: No  . Drug use: No    No Known Allergies  Current Outpatient Medications  Medication Sig Dispense Refill  . acetaminophen (TYLENOL) 500 MG tablet Take 1,000 mg by mouth daily as needed for moderate pain.    Marland Kitchen ibuprofen (ADVIL,MOTRIN) 800 MG tablet Take 1 tablet (800 mg total) by mouth 3 (three) times daily. 21 tablet 0  . ondansetron (ZOFRAN ODT) 4 MG disintegrating tablet Take 1 tablet (4 mg total) by mouth every 8 (eight) hours as needed for nausea or vomiting. (Patient not taking: Reported on 02/15/2018) 3 tablet 0   No current facility-administered medications for this visit.     Review of Systems Review of Systems  Constitutional: Negative.   Gastrointestinal: Negative.   Genitourinary: Positive for menstrual problem and vaginal bleeding. Negative for dyspareunia, pelvic pain and vaginal discharge.    Blood pressure 119/76, pulse 72, height 5\' 3"  (1.6 m), weight 136 lb (61.7 kg), last menstrual period 03/21/2018.  Physical Exam Physical Exam  Constitutional: She appears well-developed and well-nourished. No distress.  Pulmonary/Chest: Effort normal. No respiratory distress.   Psychiatric: She has a normal mood and affect. Her behavior is normal.  Vitals reviewed.   Data Reviewed CLINICAL DATA:  Initial evaluation for old acute pelvic pain with vaginal bleeding for 2 weeks.  EXAM: TRANSABDOMINAL AND TRANSVAGINAL ULTRASOUND OF PELVIS  TECHNIQUE: Both transabdominal and transvaginal ultrasound examinations of the pelvis were performed. Transabdominal technique was performed for global imaging of the pelvis including uterus, ovaries, adnexal regions, and pelvic cul-de-sac. It was necessary to proceed with endovaginal exam following the transabdominal exam to visualize the uterus, endometrium, and ovaries.  COMPARISON:  None  FINDINGS: Uterus  Measurements: 7.6 x 3.3 x 4.5 cm. No fibroids or other mass visualized.  Endometrium  Thickness: 11 mm.  No focal abnormality visualized.  Right ovary  Measurements: 0.3 x 2.2 x 2.6 cm. Normal appearance/no adnexal mass.  Left ovary  Measurements: 3.6 x 2.3 x 2.7 cm. 1.7 x 1.4 x 1.7 cm simple cyst, most consistent with a normal physiologic follicular cyst/dominant follicle.  Other findings  Small volume free physiologic fluid within the pelvis.  IMPRESSION: 1. 1.7 cm simple left ovarian cyst, most consistent with a normal physiologic follicular cyst/dominant follicle. Associated small volume free physiologic fluid within the pelvis. 2. Otherwise unremarkable and normal pelvic ultrasound.   Electronically Signed   By: Rise Mu M.D.   On: 01/27/2018 23:19 Results for AAMORI, MCMASTERS (MRN [id number]) as of 03/29/2018 15:47  Ref. Range 01/27/2018 00:00  Chlamydia Unknown **POSITIVE** (A)  Neisseria gonorrhea Unknown Negative    Assessment    S/p treatment for CT  Nexplanon insertion    Plan    TOC chlamydia Back-up BC for one week after nexplanon       Scheryl DarterJames  03/29/2018, 3:47 PM

## 2018-03-30 LAB — GC/CHLAMYDIA PROBE AMP (~~LOC~~) NOT AT ARMC
Chlamydia: NEGATIVE
Neisseria Gonorrhea: NEGATIVE

## 2018-04-01 ENCOUNTER — Other Ambulatory Visit: Payer: Self-pay

## 2018-04-01 ENCOUNTER — Emergency Department (HOSPITAL_COMMUNITY)
Admission: EM | Admit: 2018-04-01 | Discharge: 2018-04-01 | Disposition: A | Discharge status: Home / Self Care | Payer: Medicaid Other | Attending: Emergency Medicine | Admitting: Emergency Medicine

## 2018-04-01 ENCOUNTER — Encounter (HOSPITAL_COMMUNITY): Payer: Self-pay | Admitting: Emergency Medicine

## 2018-04-01 DIAGNOSIS — R519 Headache, unspecified: Secondary | ICD-10-CM

## 2018-04-01 DIAGNOSIS — R51 Headache: Secondary | ICD-10-CM | POA: Insufficient documentation

## 2018-04-01 NOTE — Discharge Instructions (Signed)
Please call your primary care doctor for recheck Call neurology for follow up for your headaches

## 2018-04-01 NOTE — ED Triage Notes (Signed)
Patient reports hx migraines. Seen previously for same. Reports continued intermittent headaches with N/V and pain radiating down left neck and into arm. Patient requesting "a scan" because of "family hx of aneurysms."

## 2018-04-01 NOTE — ED Provider Notes (Signed)
[facility]-EMERGENCY DEPT Provider Note   CSN: [id number] Arrival date & time: 04/01/18  2020     History   Chief Complaint Chief Complaint  Patient presents with  . Headache    HPI Christine Howe is a 22 y.o. female.  HPI  22 year old female presents today stating that she has been having headaches intermittently for many years.  Currently does not have a headache.  States she may come and go and lasts approximately 5 minutes.  She has been having these from before 22 years of age.  Does not treat them generally because they resolve spontaneously.  She denies any other symptoms except some occasional pain in her left shoulder.  She has not had fever, chills, or head injury.  She denies any visual changes, difficulty walking, speech difficulty or lateralized weakness.  History reviewed. No pertinent past medical history.  Patient Active Problem List   Diagnosis Date Noted  . Chlamydia 03/29/2018  . Nexplanon insertion 03/29/2018    Past Surgical History:  Procedure Laterality Date  . WISDOM TOOTH EXTRACTION       OB History    Gravida  0   Para  0   Term  0   Preterm  0   AB  0   Living  0     SAB  0   TAB  0   Ectopic  0   Multiple  0   Live Births  0            Home Medications    Prior to Admission medications   Medication Sig Start Date End Date Taking? Authorizing Provider  acetaminophen (TYLENOL) 500 MG tablet Take 1,000 mg by mouth daily as needed for moderate pain.    [provider]  ibuprofen (ADVIL,MOTRIN) 800 MG tablet Take 1 tablet (800 mg total) by mouth 3 (three) times daily. 11/13/16   Gilda CreasePollina, Christopher J, MD  ondansetron (ZOFRAN ODT) 4 MG disintegrating tablet Take 1 tablet (4 mg total) by mouth every 8 (eight) hours as needed for nausea or vomiting. Patient not taking: Reported on 02/15/2018 11/08/17   Dietrich PatesKhatri, Hina, PA-C    Family History No family history on file.  Social History Social  History   Tobacco Use  . Smoking status: Never Smoker  . Smokeless tobacco: Never Used  Substance Use Topics  . Alcohol use: No  . Drug use: No     Allergies   Patient has no known allergies.   Review of Systems Review of Systems  All other systems reviewed and are negative.    Physical Exam Updated Vital Signs BP (!) 124/104 (BP Location: Left Arm)   Pulse 80   Temp 98.8 F (37.1 C) (Oral)   Resp 13   Ht 1.6 m (5\' 3" )   Wt 61.7 kg   LMP 03/21/2018   SpO2 100%   BMI 24.09 kg/m   Physical Exam  Constitutional: She is oriented to person, place, and time. She appears well-developed and well-nourished.  HENT:  Head: Normocephalic and atraumatic.  Right Ear: External ear normal.  Left Ear: External ear normal.  Nose: Nose normal.  Mouth/Throat: Oropharynx is clear and moist.  Eyes: Pupils are equal, round, and reactive to light. Conjunctivae and EOM are normal.  Neck: Normal range of motion. Neck supple. No JVD present. No tracheal deviation present. No thyromegaly present.  Cardiovascular: Normal rate, regular rhythm, normal heart sounds and intact distal pulses.  Pulmonary/Chest: Effort normal and breath  sounds normal. She has no wheezes.  Abdominal: Soft. Bowel sounds are normal. She exhibits no mass. There is no tenderness. There is no guarding.  Musculoskeletal: Normal range of motion.  Lymphadenopathy:    She has no cervical adenopathy.  Neurological: She is alert and oriented to person, place, and time. She has normal reflexes. No cranial nerve deficit or sensory deficit. Gait normal. GCS eye subscore is 4. GCS verbal subscore is 5. GCS motor subscore is 6. She displays no Babinski's sign on the right side. She displays no Babinski's sign on the left side.  Reflex Scores:      Bicep reflexes are 2+ on the right side and 2+ on the left side.      Patellar reflexes are 2+ on the right side and 2+ on the left side. Strength is normal and equal  throughout. Cranial nerves grossly intact. Patient fluent. No gross ataxia and patient able to ambulate without difficulty. Patellar and brachial radial are equal bilaterally  Skin: Skin is warm and dry.  Psychiatric: She has a normal mood and affect. Her behavior is normal. Judgment and thought content normal.  Nursing note and vitals reviewed.    ED Treatments / Results  Labs (all labs ordered are listed, but only abnormal results are displayed) Labs Reviewed - No data to display  EKG None  Radiology No results found.  Procedures Procedures (including critical care time)  Medications Ordered in ED Medications - No data to display   Initial Impression / Assessment and Plan / ED Course  I have reviewed the triage vital signs and the nursing notes.  Pertinent labs & imaging results that were available during my care of the patient were reviewed by me and considered in my medical decision making (see chart for details).       Final Clinical Impressions(s) / ED Diagnoses   Final diagnoses:  Chronic nonintractable headache, unspecified headache type    ED Discharge Orders    None       Margarita Grizzle, MD 04/01/18 2138

## 2018-04-02 ENCOUNTER — Encounter: Payer: Self-pay | Admitting: Neurology

## 2018-06-07 NOTE — Progress Notes (Deleted)
NEUROLOGY CONSULTATION NOTE  Christine Howe MRN: [id number] DOB: 1995-08-28  Referring provider: Margarita Grizzle, MD (ED referral) Primary care provider: No PCP  Reason for consult:  headache  HISTORY OF PRESENT ILLNESS: Christine Howe is a 23 year old ***-handed woman who presents for headaches.  History supplemented by ED notes.  Onset:  *** Location:  *** Quality:  *** Intensity:  ***.  *** denies new headache, thunderclap headache or severe headache that wakes *** from sleep. Aura:  *** Prodrome:  *** Postdrome:  *** Associated symptoms:  ***.  *** denies associated unilateral numbness or weakness. Duration:  *** Frequency:  *** Frequency of abortive medication: *** Triggers:  *** Exacerbating factors:  *** Relieving factors:  *** Activity:  ***  Current NSAIDS:  Ibuprofen 800mg  Current analgesics:  Tylenol Current triptans:  *** Current ergotamine:  *** Current anti-emetic:  Zofran ODT 4mg  Current muscle relaxants:  *** Current anti-anxiolytic:  *** Current sleep aide:  *** Current Antihypertensive medications:  *** Current Antidepressant medications:  *** Current Anticonvulsant medications:  *** Current anti-CGRP:  *** Current Vitamins/Herbal/Supplements:  *** Current Antihistamines/Decongestants:  *** Other therapy:  *** Other medication:  ***  Past NSAIDS:  *** Past analgesics:  *** Past abortive triptans:  *** Past abortive ergotamine:  *** Past muscle relaxants:  *** Past anti-emetic:  *** Past antihypertensive medications:  *** Past antidepressant medications:  *** Past anticonvulsant medications:  *** Past anti-CGRP:  *** Past vitamins/Herbal/Supplements:  *** Past antihistamines/decongestants:  *** Other past therapies:  ***  Caffeine:  *** Alcohol:  *** Smoker:  *** Diet:  *** Exercise:  *** Depression:  ***; Anxiety:  *** Other pain:  *** Sleep hygiene:  *** Family history of headache:  ***  CBC, BMP, HIV and RPR from 01/27/18 were  normal.   PAST MEDICAL HISTORY: No past medical history on file.  PAST SURGICAL HISTORY: Past Surgical History:  Procedure Laterality Date  . WISDOM TOOTH EXTRACTION      MEDICATIONS: Current Outpatient Medications on File Prior to Visit  Medication Sig Dispense Refill  . acetaminophen (TYLENOL) 500 MG tablet Take 1,000 mg by mouth daily as needed for moderate pain.    Marland Kitchen ibuprofen (ADVIL,MOTRIN) 800 MG tablet Take 1 tablet (800 mg total) by mouth 3 (three) times daily. 21 tablet 0  . ondansetron (ZOFRAN ODT) 4 MG disintegrating tablet Take 1 tablet (4 mg total) by mouth every 8 (eight) hours as needed for nausea or vomiting. (Patient not taking: Reported on 02/15/2018) 3 tablet 0   No current facility-administered medications on file prior to visit.     ALLERGIES: No Known Allergies  FAMILY HISTORY: No family history on file. ***.  SOCIAL HISTORY: Social History   Socioeconomic History  . Marital status: Single    Spouse name: Not on file  . Number of children: Not on file  . Years of education: Not on file  . Highest education level: Not on file  Occupational History  . Not on file  Social Needs  . Financial resource strain: Not on file  . Food insecurity:    Worry: Not on file    Inability: Not on file  . Transportation needs:    Medical: Not on file    Non-medical: Not on file  Tobacco Use  . Smoking status: Never Smoker  . Smokeless tobacco: Never Used  Substance and Sexual Activity  . Alcohol use: No  . Drug use: No  . Sexual activity: Yes    Birth  control/protection: None  Lifestyle  . Physical activity:    Days per week: Not on file    Minutes per session: Not on file  . Stress: Not on file  Relationships  . Social connections:    Talks on phone: Not on file    Gets together: Not on file    Attends religious service: Not on file    Active member of club or organization: Not on file    Attends meetings of clubs or organizations: Not on file     Relationship status: Not on file  . Intimate partner violence:    Fear of current or ex partner: Not on file    Emotionally abused: Not on file    Physically abused: Not on file    Forced sexual activity: Not on file  Other Topics Concern  . Not on file  Social History Narrative  . Not on file    REVIEW OF SYSTEMS: Constitutional: No fevers, chills, or sweats, no generalized fatigue, change in appetite Eyes: No visual changes, double vision, eye pain Ear, nose and throat: No hearing loss, ear pain, nasal congestion, sore throat Cardiovascular: No chest pain, palpitations Respiratory:  No shortness of breath at rest or with exertion, wheezes GastrointestinaI: No nausea, vomiting, diarrhea, abdominal pain, fecal incontinence Genitourinary:  No dysuria, urinary retention or frequency Musculoskeletal:  No neck pain, back pain Integumentary: No rash, pruritus, skin lesions Neurological: as above Psychiatric: No depression, insomnia, anxiety Endocrine: No palpitations, fatigue, diaphoresis, mood swings, change in appetite, change in weight, increased thirst Hematologic/Lymphatic:  No purpura, petechiae. Allergic/Immunologic: no itchy/runny eyes, nasal congestion, recent allergic reactions, rashes  PHYSICAL EXAM: *** General: No acute distress.  Patient appears ***-groomed.  *** Head:  Normocephalic/atraumatic Eyes:  fundi examined but not visualized Neck: supple, no paraspinal tenderness, full range of motion Back: No paraspinal tenderness Heart: regular rate and rhythm Lungs: Clear to auscultation bilaterally. Vascular: No carotid bruits. Neurological Exam: Mental status: alert and oriented to person, place, and time, recent and remote memory intact, fund of knowledge intact, attention and concentration intact, speech fluent and not dysarthric, language intact. Cranial nerves: CN I: not tested CN II: pupils equal, round and reactive to light, visual fields intact CN III, IV, VI:   full range of motion, no nystagmus, no ptosis CN V: facial sensation intact CN VII: upper and lower face symmetric CN VIII: hearing intact CN IX, X: gag intact, uvula midline CN XI: sternocleidomastoid and trapezius muscles intact CN XII: tongue midline Bulk & Tone: normal, no fasciculations. Motor:  5/5 throughout *** Sensation:  Pinprick *** temperature *** and vibration sensation intact.  ***. Deep Tendon Reflexes:  2+ throughout, *** toes downgoing.  *** Finger to nose testing:  Without dysmetria.  *** Heel to shin:  Without dysmetria.  *** Gait:  Normal station and stride.  Able to turn and tandem walk. Romberg ***.  IMPRESSION: ***  PLAN: ***  Thank you for allowing me to take part in the care of this patient.  Shon MilletAdam Jaffe, DO

## 2018-06-08 ENCOUNTER — Encounter: Payer: Self-pay | Admitting: Neurology

## 2018-06-08 ENCOUNTER — Ambulatory Visit: Payer: Self-pay | Admitting: Neurology

## 2018-09-30 IMAGING — CT CT ANGIO CHEST
2 of 6 series · 19 of 46 positions shown · IV contrast (ISOVUE 370)
Comparison: Chest radiograph dated 05/27/2017

CLINICAL DATA: 21-year-old female with chest pain.

EXAM:
CT ANGIOGRAPHY CHEST WITH CONTRAST
TECHNIQUE: Multidetector CT imaging of the chest was performed using the
standard protocol during bolus administration of intravenous
contrast. Multiplanar CT image reconstructions and MIPs were
obtained to evaluate the vascular anatomy.
CONTRAST:  80mL CC30N1-TXJ IOPAMIDOL (CC30N1-TXJ) INJECTION 76%

[Series 6: thins · axial · 0.59mm/px · z∈[-74,+138]mm · 16 of 235 slices shown]
[im 11/235  lung]
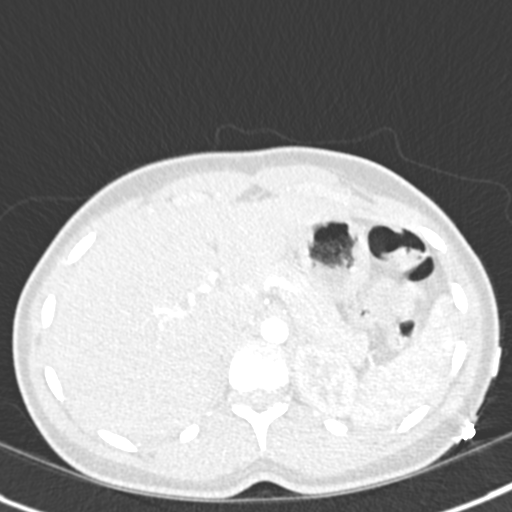
[im 31/235  soft-tissue]
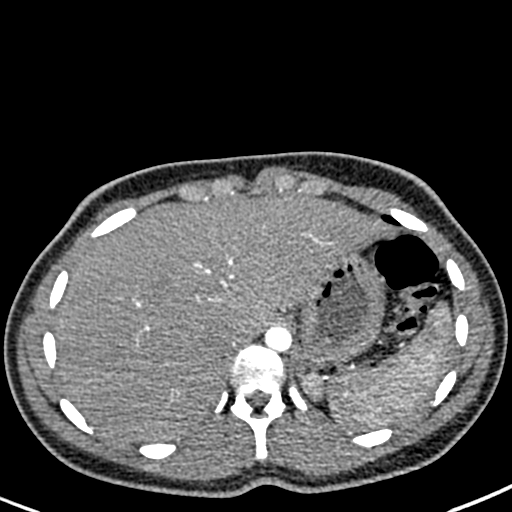
[im 41/235  lung]
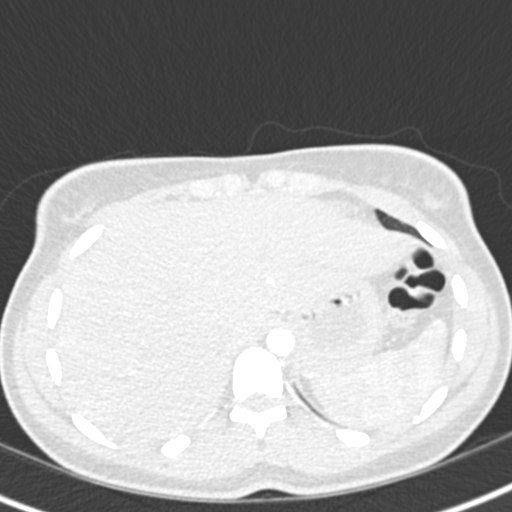
[im 51/235  soft-tissue]
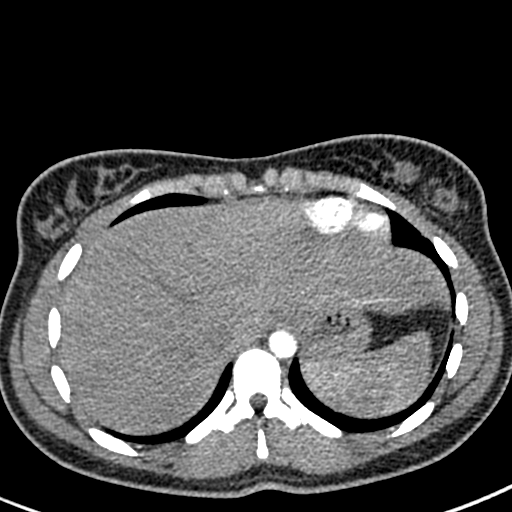
[im 72/235  lung]
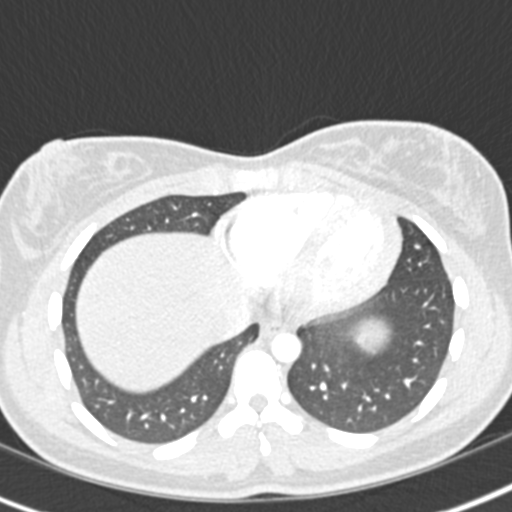
[im 82/235  soft-tissue]
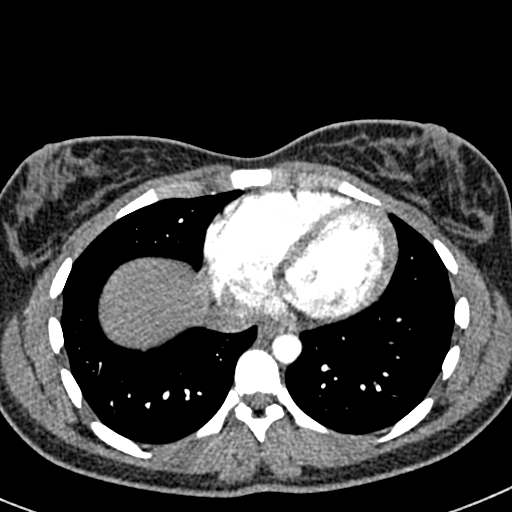
[im 92/235  lung]
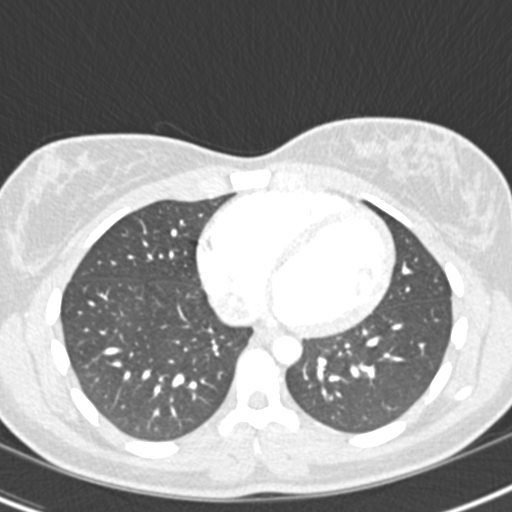
[im 112/235  soft-tissue]
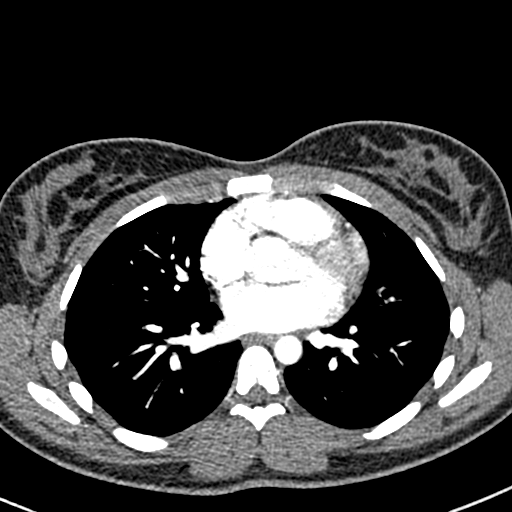
[im 123/235  lung]
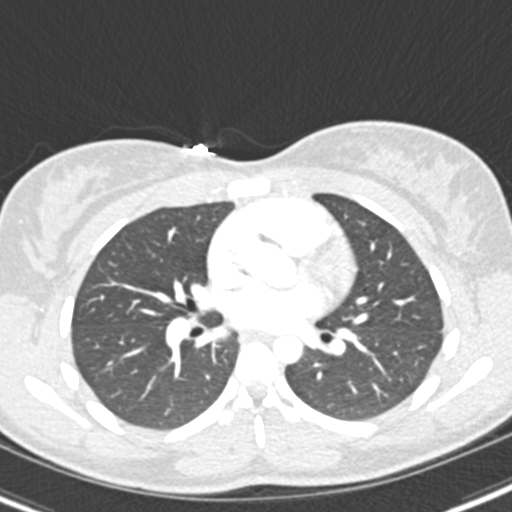
[im 143/235  soft-tissue]
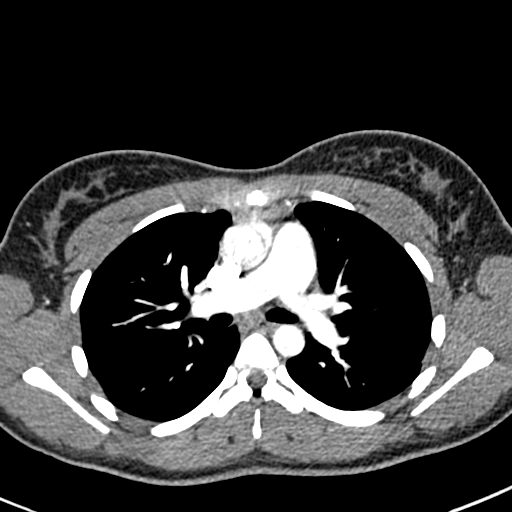
[im 153/235  lung]
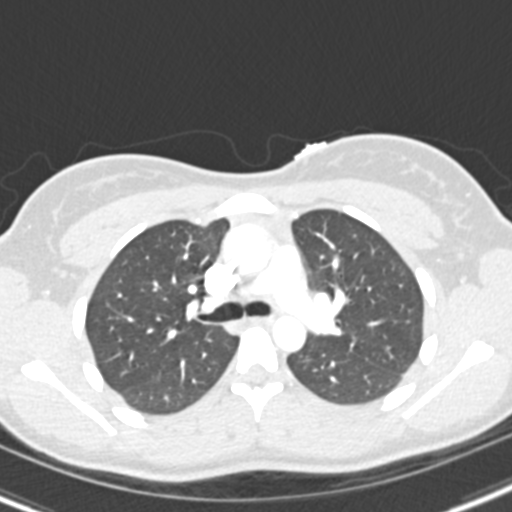
[im 163/235  soft-tissue]
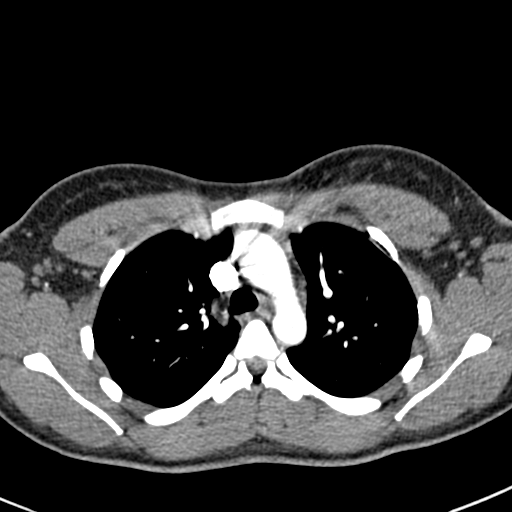
[im 184/235  lung]
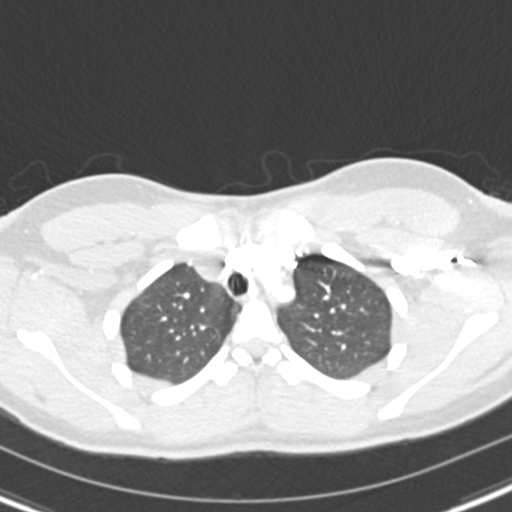
[im 194/235  soft-tissue]
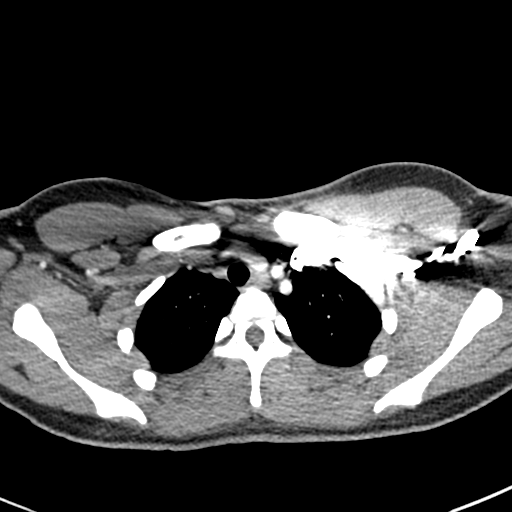
[im 204/235  lung]
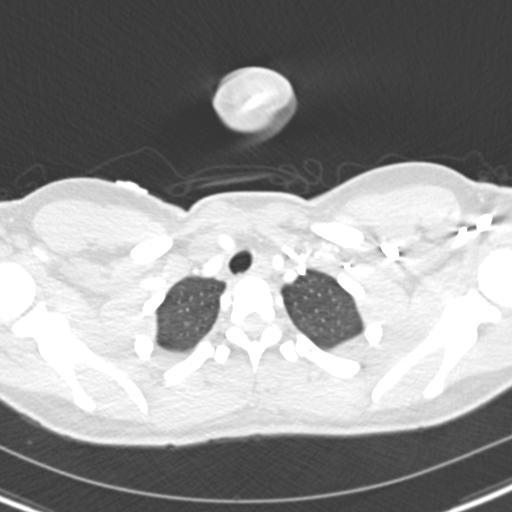
[im 224/235  soft-tissue]
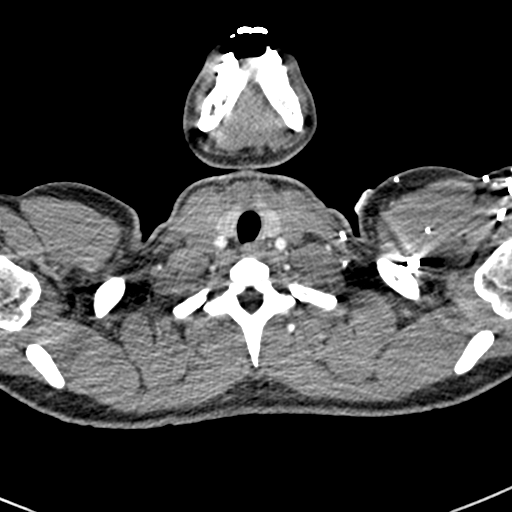

[Series 7: coronal mpr · coronal · 0.48mm/px · 3 of 94 slices shown]
[im 24/94  soft-tissue]
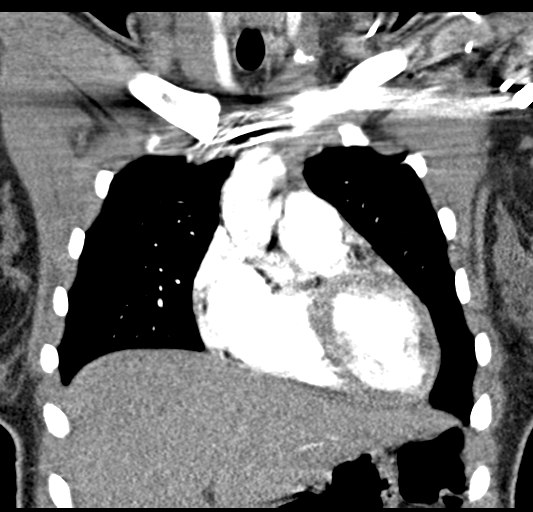
[im 47/94  soft-tissue]
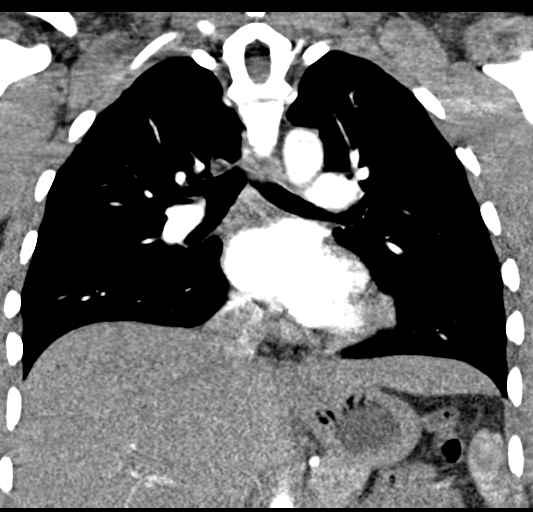
[im 70/94  soft-tissue]
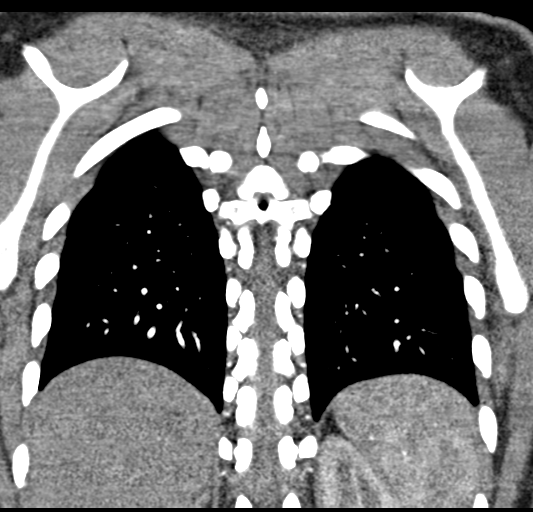

[19 of 46 positions shown; findings below may reference images not displayed]

FINDINGS: Cardiovascular: There is no cardiomegaly or pericardial effusion.
The thoracic aorta is unremarkable. The origins of the great vessels
of the aortic arch are patent. There is no CT evidence of pulmonary
embolism.

Mediastinum/Nodes: No hilar or mediastinal adenopathy. Esophagus and
the thyroid gland are grossly unremarkable. No mediastinal fluid
collection.

Lungs/Pleura: The lungs are clear. There is no pleural effusion or
pneumothorax. The central airways are patent.

Upper Abdomen: No acute abnormality.

Musculoskeletal: No chest wall abnormality. No acute or significant
osseous findings.

Review of the MIP images confirms the above findings.
IMPRESSION: No acute intrathoracic pathology. No CT evidence of pulmonary
embolism.

## 2019-03-17 ENCOUNTER — Encounter (HOSPITAL_COMMUNITY): Payer: Self-pay | Admitting: Emergency Medicine

## 2019-03-17 ENCOUNTER — Emergency Department (HOSPITAL_COMMUNITY)
Admission: EM | Admit: 2019-03-17 | Discharge: 2019-03-17 | Disposition: A | Discharge status: Home / Self Care | Payer: Medicaid Other | Attending: Emergency Medicine | Admitting: Emergency Medicine

## 2019-03-17 ENCOUNTER — Other Ambulatory Visit: Payer: Self-pay

## 2019-03-17 DIAGNOSIS — Z20828 Contact with and (suspected) exposure to other viral communicable diseases: Secondary | ICD-10-CM | POA: Insufficient documentation

## 2019-03-17 DIAGNOSIS — J029 Acute pharyngitis, unspecified: Secondary | ICD-10-CM

## 2019-03-17 DIAGNOSIS — R05 Cough: Secondary | ICD-10-CM | POA: Insufficient documentation

## 2019-03-17 LAB — GROUP A STREP BY PCR: Group A Strep by PCR: NOT DETECTED

## 2019-03-17 MED ORDER — FLUTICASONE PROPIONATE 50 MCG/ACT NA SUSP: 2.0000 | Freq: Every day | NASAL | 2 refills | Status: DC

## 2019-03-17 MED ORDER — IBUPROFEN 800 MG PO TABS: 800.0000 mg | ORAL_TABLET | Freq: Three times a day (TID) | ORAL | 0 refills | Status: AC

## 2019-03-17 NOTE — Discharge Instructions (Signed)
Please use the Flonase and the ibuprofen, as prescribed.  Please call [facility] get established with a PCP.  Return to the ED or seek medical attention for any fevers, chills, difficulty breathing, or any other new or worsening symptoms.   If you live with, or provide care at home for, a person confirmed to have, or being evaluated for, COVID-19 infection please follow these guidelines to prevent infection:  Follow healthcare providers instructions Make sure that you understand and can help the patient follow any healthcare provider instructions for all care.  Provide for the patients basic needs You should help the patient with basic needs in the home and provide support for getting groceries, prescriptions, and other personal needs.  Monitor the patients symptoms If they are getting sicker, call his or her medical provider a  This will help the healthcare providers office take steps to keep other people from getting infected. Ask the healthcare provider to call the local or state health department.  Limit the number of people who have contact with the patient If possible, have only one caregiver for the patient. Other household members should stay in another home or place of residence. If this is not possible, they should stay in another room, or be separated from the patient as much as possible. Use a separate bathroom, if available. Restrict visitors who do not have an essential need to be in the home.  Keep older adults, very young children, and other sick people away from the patient Keep older adults, very young children, and those who have compromised immune systems or chronic health conditions away from the patient. This includes people with chronic heart, lung, or kidney conditions, diabetes, and cancer.  Ensure good ventilation Make sure that shared spaces in the home have good air flow, such as from an air conditioner or an opened  window, weather permitting.  Wash your hands often Wash your hands often and thoroughly with soap and water for at least 20 seconds. You can use an alcohol based hand sanitizer if soap and water are not available and if your hands are not visibly dirty. Avoid touching your eyes, nose, and mouth with unwashed hands. Use disposable paper towels to dry your hands. If not available, use dedicated cloth towels and replace them when they become wet.  Wear a facemask and gloves Wear a disposable facemask at all times in the room and gloves when you touch or have contact with the patients blood, body fluids, and/or secretions or excretions, such as sweat, saliva, sputum, nasal mucus, vomit, urine, or feces.  Ensure the mask fits over your nose and mouth tightly, and do not touch it during use. Throw out disposable facemasks and gloves after using them. Do not reuse. Wash your hands immediately after removing your facemask and gloves. If your personal clothing becomes contaminated, carefully remove clothing and launder. Wash your hands after handling contaminated clothing. Place all used disposable facemasks, gloves, and other waste in a lined container before disposing them with other household waste. Remove gloves and wash your hands immediately after handling these items.  Do not share dishes, glasses, or other household items with the patient Avoid sharing household items. You should not share dishes, drinking glasses, cups, eating utensils, towels, bedding, or other items After the person uses these items, you should wash them thoroughly with soap and water.  Wash laundry thoroughly Immediately remove and wash clothes or bedding that have blood, body fluids, and/or secretions or  excretions, such as sweat, saliva, sputum, nasal mucus, vomit, urine, or feces, on them. Wear gloves when handling laundry from the patient. Read and follow directions on labels of laundry or clothing items and detergent.  In general, wash and dry with the warmest temperatures recommended on the label.  Clean all areas the individual has used often Clean all touchable surfaces, such as counters, tabletops, doorknobs, bathroom fixtures, toilets, phones, keyboards, tablets, and bedside tables, every day. Also, clean any surfaces that may have blood, body fluids, and/or secretions or excretions on them. Wear gloves when cleaning surfaces the patient has come in contact with. Use a diluted bleach solution (e.g., dilute bleach with 1 part bleach and 10 parts water) or a household disinfectant with a label that says EPA-registered for coronaviruses. To make a bleach solution at home, add 1 tablespoon of bleach to 1 quart (4 cups) of water. For a larger supply, add  cup of bleach to 1 gallon (16 cups) of water. Read labels of cleaning products and follow recommendations provided on product labels. Labels contain instructions for safe and effective use of the cleaning product including precautions you should take when applying the product, such as wearing gloves or eye protection and making sure you have good ventilation during use of the product. Remove gloves and wash hands immediately after cleaning.  Monitor yourself for signs and symptoms of illness Caregivers and household members are considered close contacts, should monitor their health, and will be asked to limit movement outside of the home to the extent possible. Follow the monitoring steps for close contacts listed on the symptom monitoring form.   ? If you have additional questions, contact your local health department or call the epidemiologist on call at [phone] (available 24/7). ? This guidance is subject to change. For the most up-to-date guidance from [facility], please refer to their website: [email or web address]

## 2019-03-17 NOTE — ED Provider Notes (Addendum)
[facility] EMERGENCY DEPARTMENT Provider Note   CSN: [id number] Arrival date & time: 03/17/19  1203     History   Chief Complaint Chief Complaint  Patient presents with  . Sore Throat  . Cough    HPI Christine Howe is a 23 y.o. female with no relevant past medical history presents to the ED for complaints of 2-week history of aphthous ulcers.  She also reports feeling sinus congestion described as constant dull pain behind her eyes beginning 3 days ago. She also reports "tonsil discomfort" and mild pain with swallowing.  She denies any runny nose, nasal congestion, cough, fevers or chills, chest pain or shortness of breath, abdominal pain, nausea vomiting, urinary symptoms, change in smell or taste, body aches, or any other symptoms.  She has been nasal rinses for her sinus congestion and she has just been doing salt water rinses for her aphthous ulcers and sore throat symptoms.  She is concerned that she might have strep throat or COVID-19.     HPI  History reviewed. No pertinent past medical history.  Patient Active Problem List   Diagnosis Date Noted  . Chlamydia 03/29/2018  . Nexplanon insertion 03/29/2018    Past Surgical History:  Procedure Laterality Date  . WISDOM TOOTH EXTRACTION       OB History    Gravida  0   Para  0   Term  0   Preterm  0   AB  0   Living  0     SAB  0   TAB  0   Ectopic  0   Multiple  0   Live Births  0            Home Medications    Prior to Admission medications   Medication Sig Start Date End Date Taking? Authorizing Provider  acetaminophen (TYLENOL) 500 MG tablet Take 1,000 mg by mouth daily as needed for moderate pain.    [provider]  fluticasone (FLONASE) 50 MCG/ACT nasal spray Place 2 sprays into both nostrils daily. 03/17/19   Lorelee New, PA-C  ibuprofen (ADVIL) 800 MG tablet Take 1 tablet (800 mg total) by mouth 3 (three) times daily. 03/17/19   Lorelee New, PA-C   ondansetron (ZOFRAN ODT) 4 MG disintegrating tablet Take 1 tablet (4 mg total) by mouth every 8 (eight) hours as needed for nausea or vomiting. Patient not taking: Reported on 02/15/2018 11/08/17   Dietrich Pates, PA-C    Family History No family history on file.  Social History Social History   Tobacco Use  . Smoking status: Never Smoker  . Smokeless tobacco: Never Used  Substance Use Topics  . Alcohol use: No  . Drug use: No     Allergies   Patient has no known allergies.   Review of Systems Review of Systems  All other systems reviewed and are negative.    Physical Exam Updated Vital Signs BP 128/81 (BP Location: Left Arm)   Pulse 94   Temp 99.6 F (37.6 C) (Oral)   Resp 14   SpO2 100%   Physical Exam Vitals signs and nursing note reviewed. Exam conducted with a chaperone present.  Constitutional:      Appearance: Normal appearance.  HENT:     Head: Normocephalic and atraumatic.     Comments: No significant sinus tenderness to palpation.  Not worse with leaning forward.    Nose: No congestion or rhinorrhea.     Mouth/Throat:  Comments: 2 aphthous ulcers noted on the inside of her cheek.  Nonbleeding.  No tonsillar hypertrophy or exudates.  No tongue or soft palate swelling.  Oropharynx is patent.  No uvular deviation. Eyes:     General: No scleral icterus.    Conjunctiva/sclera: Conjunctivae normal.  Neck:     Musculoskeletal: Normal range of motion and neck supple. No neck rigidity or muscular tenderness.  Cardiovascular:     Rate and Rhythm: Normal rate and regular rhythm.     Pulses: Normal pulses.     Heart sounds: Normal heart sounds.  Pulmonary:     Effort: Pulmonary effort is normal. No respiratory distress.     Breath sounds: Normal breath sounds. No wheezing.  Abdominal:     General: Abdomen is flat. There is no distension.     Palpations: Abdomen is soft.     Tenderness: There is no abdominal tenderness. There is no guarding.  Skin:     General: Skin is dry.  Neurological:     Mental Status: She is alert.     GCS: GCS eye subscore is 4. GCS verbal subscore is 5. GCS motor subscore is 6.  Psychiatric:        Mood and Affect: Mood normal.        Behavior: Behavior normal.        Thought Content: Thought content normal.      ED Treatments / Results  Labs (all labs ordered are listed, but only abnormal results are displayed) Labs Reviewed  GROUP A STREP BY PCR  NOVEL CORONAVIRUS, NAA (HOSP ORDER, SEND-OUT TO REF LAB; TAT 18-24 HRS)    EKG None  Radiology No results found.  Procedures Procedures (including critical care time)  Medications Ordered in ED Medications - No data to display   Initial Impression / Assessment and Plan / ED Course  I have reviewed the triage vital signs and the nursing notes.  Pertinent labs & imaging results that were available during my care of the patient were reviewed by me and considered in my medical decision making (see chart for details).        Patient has history and physical exam consistent with aphthous ulcers and mild sinus congestion.  Patient has been afebrile with no chills and her sinus complaints on the been going on for approximately 3 days.  No purulent nasal drainage.  No concern for acute sinusitis at this time.  Will prescribe Flonase and encouraged her to use that routinely until her symptoms improve as well as continue with her saline rinses.  She denies any cough.  She has been doing salt water rinses and I encouraged her to continue doing for her aphthous ulcer.  Also will recommend Chloraseptic spray and ibuprofen for her sore throat symptoms.  Will obtain rapid strep test to rule out strep throat.  Also will test for COVID-19 given her report of headache, sore throat, and "feeling crummy".  No obvious sick contacts.  She has been isolating will continue to do so until she received the results of her testing.  Rapid strep test was negative.  Return  precautions discussed.  Woodrow Dulski was evaluated in Emergency Department on 03/17/2019 for the symptoms described in the history of present illness. She was evaluated in the context of the global COVID-19 pandemic, which necessitated consideration that the patient might be at risk for infection with the SARS-CoV-2 virus that causes COVID-19. Institutional protocols and algorithms that pertain to the evaluation of patients at  risk for COVID-19 are in a state of rapid change based on information released by regulatory bodies including the CDC and federal and state organizations. These policies and algorithms were followed during the patient's care in the ED.   Final Clinical Impressions(s) / ED Diagnoses   Final diagnoses:  Sore throat    ED Discharge Orders         Ordered    fluticasone (FLONASE) 50 MCG/ACT nasal spray  Daily     03/17/19 1414    ibuprofen (ADVIL) 800 MG tablet  3 times daily     03/17/19 1414           Lorelee NewGreen, Simrat Kendrick L, PA-C 03/17/19 1751    Lorelee NewGreen, Craig Wisnewski L, PA-C 03/17/19 1751    Glynn Octaveancour, Stephen, MD 03/17/19 1756

## 2019-03-17 NOTE — ED Notes (Signed)
Pt verbalized understanding of discharge paperwork, prescriptions and follow-up care 

## 2019-03-17 NOTE — ED Triage Notes (Signed)
Pt states she has been treating herself for a cold and noticed swollen cheeks. Endorses open sore in her mouth with no improvement from salt water rinses, and cough.

## 2019-03-18 LAB — NOVEL CORONAVIRUS, NAA (HOSP ORDER, SEND-OUT TO REF LAB; TAT 18-24 HRS): SARS-CoV-2, NAA: NOT DETECTED

## 2019-06-21 IMAGING — CR DG CHEST 2V
2 series · 2 of 2 positions shown · non-contrast
Comparison: CT 05/27/2017 and CXR 05/27/2017

CLINICAL DATA: Dyspnea while smoking marijuana 1 hour ago.

EXAM:
CHEST - 2 VIEW

[w chest pa]
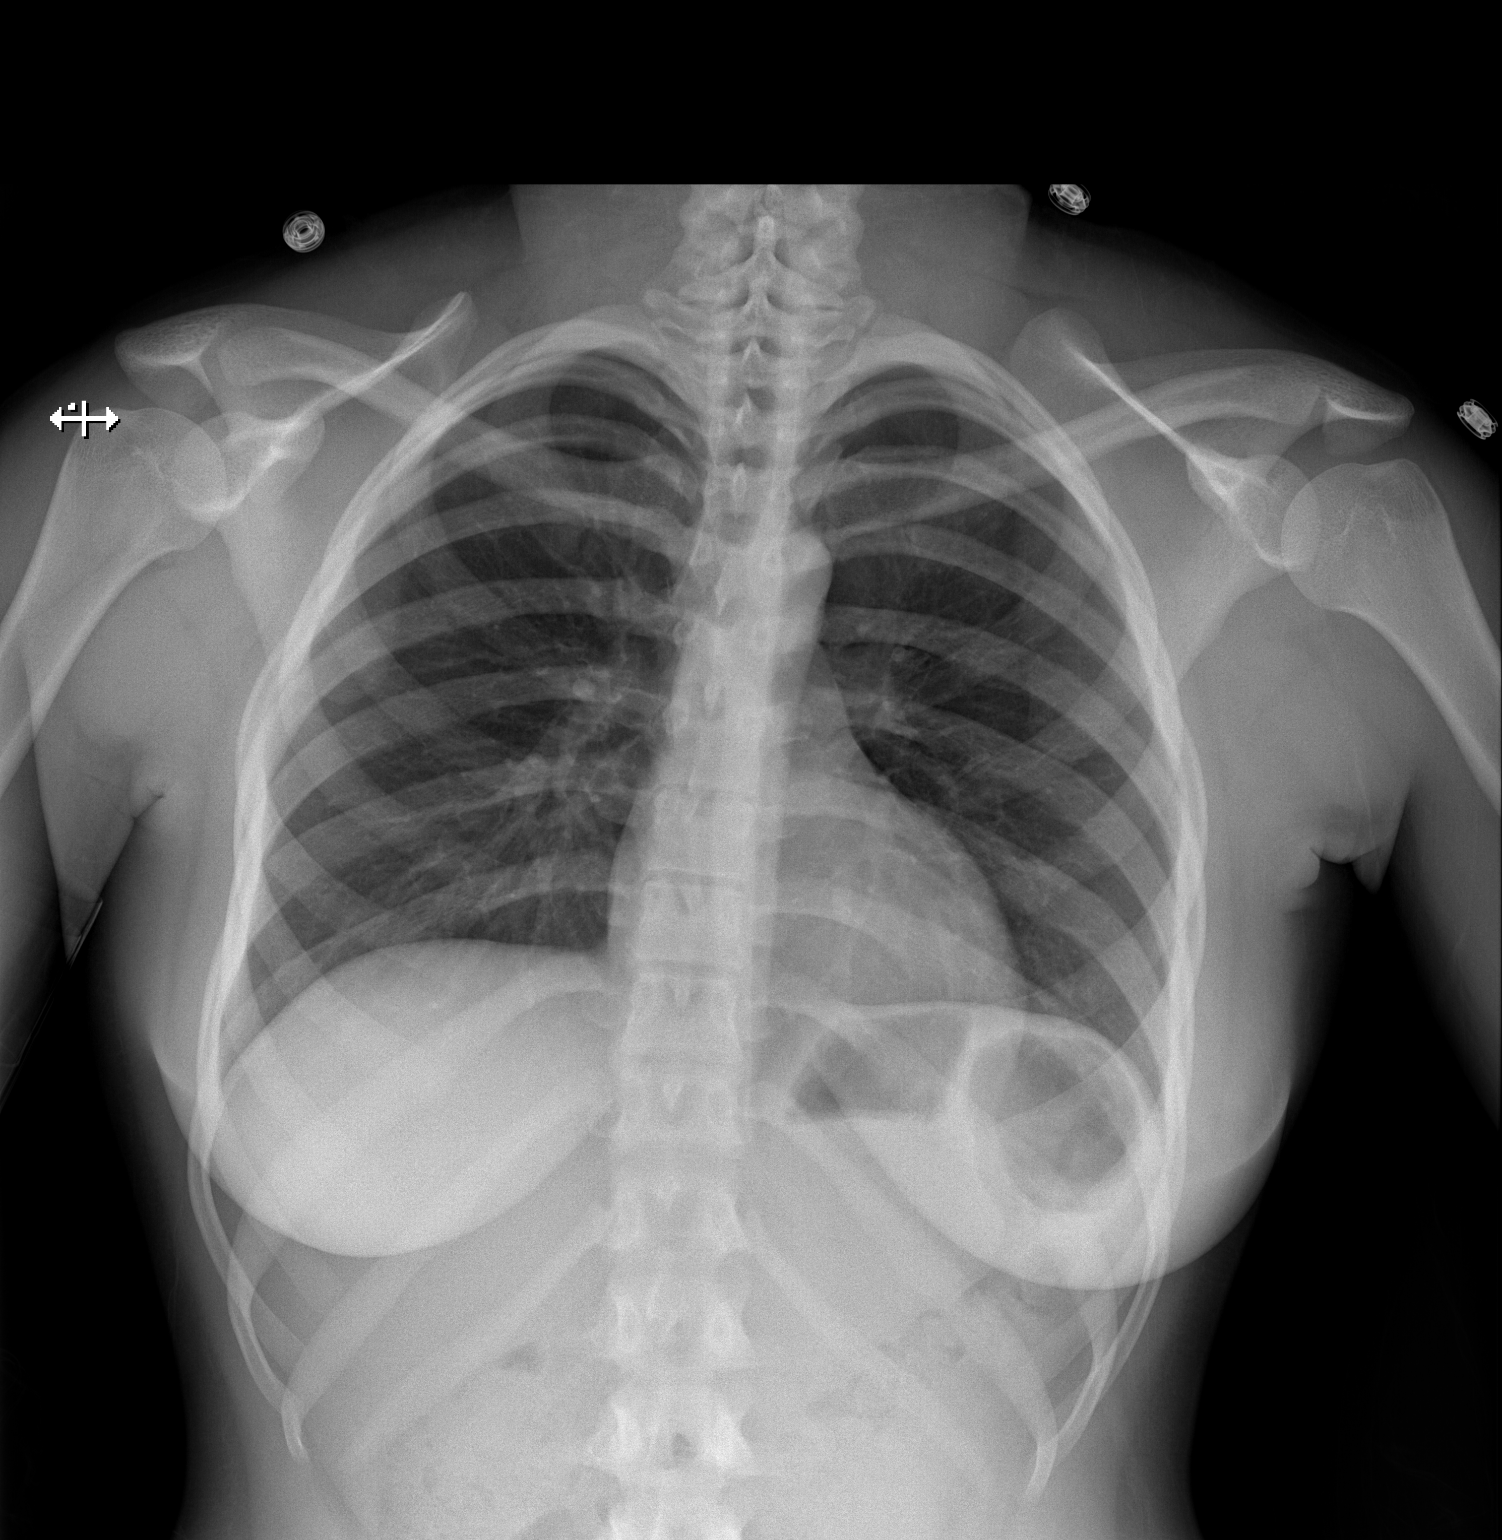

[w chest lat]
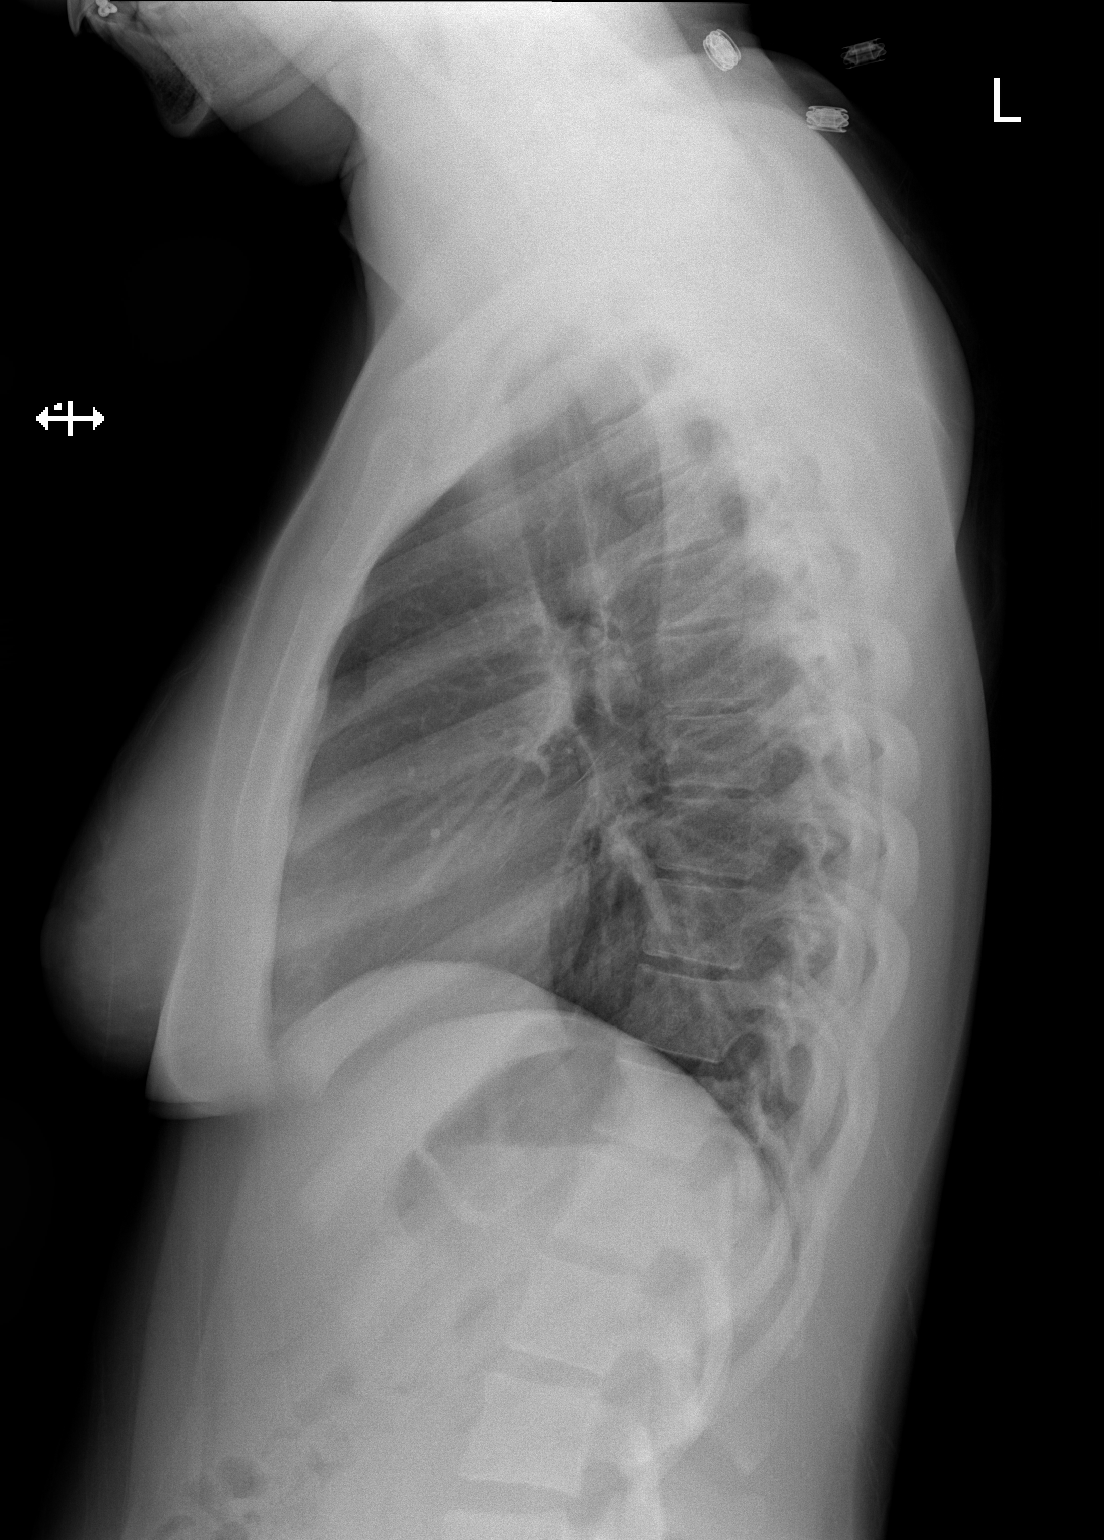

[2 of 2 positions shown; findings below may reference images not displayed]

FINDINGS: The heart size and mediastinal contours are within normal limits.
Both lungs are clear. The visualized skeletal structures are
unremarkable.
IMPRESSION: No active cardiopulmonary disease.

## 2019-09-06 IMAGING — US US TRANSVAGINAL NON-OB
1 series · 13 of 25 positions shown · non-contrast
Comparison: None

CLINICAL DATA: Initial evaluation for old acute pelvic pain with
vaginal bleeding for 2 weeks.



[Series 1: us transvaginal non-ob · 0.22mm/px · 13 of 121 slices shown]
[im 1/121]
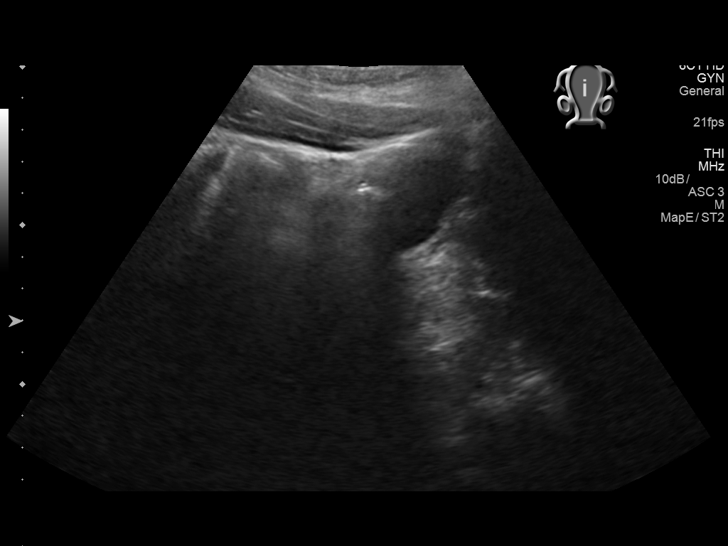
[im 11/121]
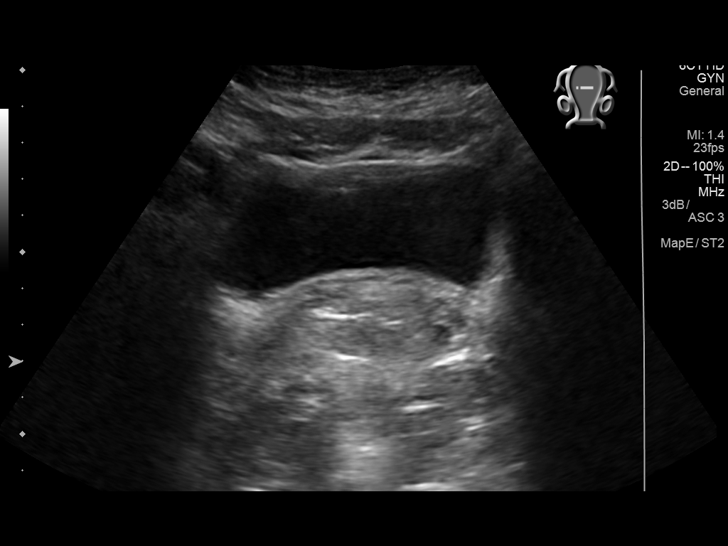
[im 21/121]
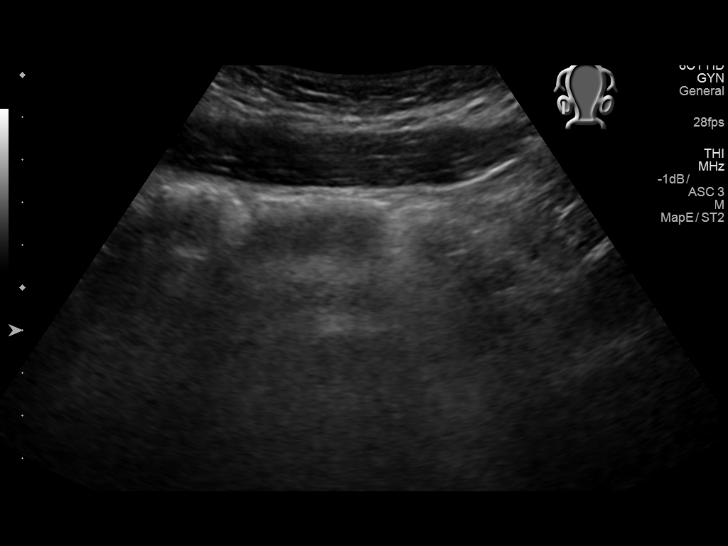
[im 31/121]
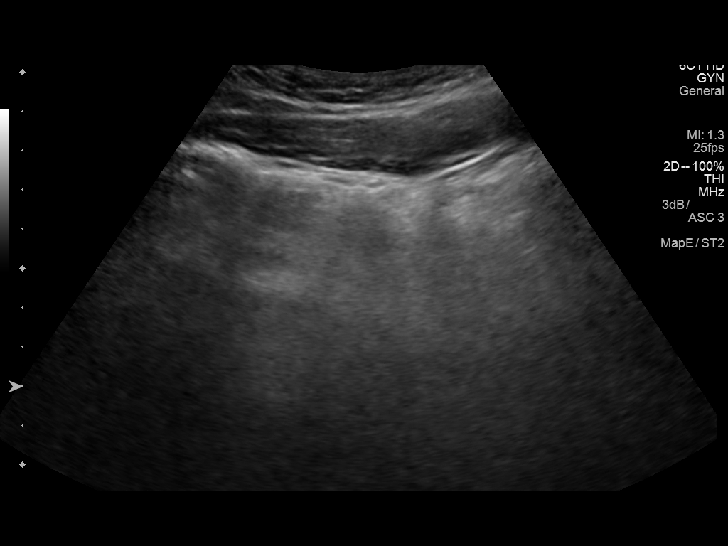
[im 41/121]
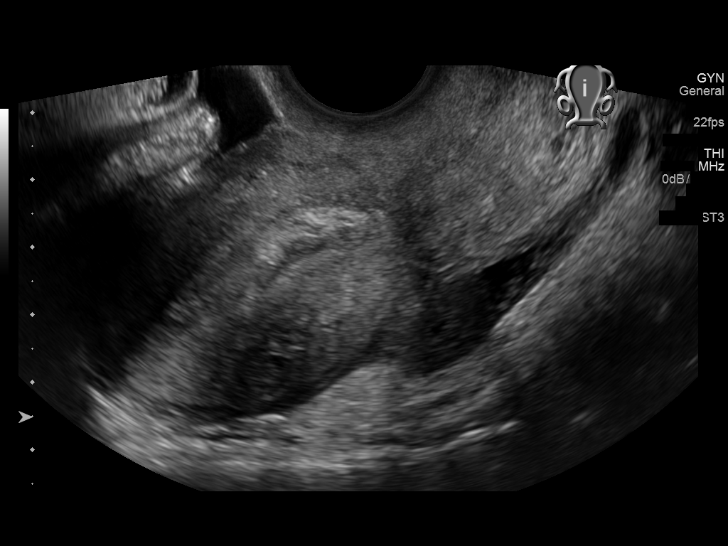
[im 51/121]
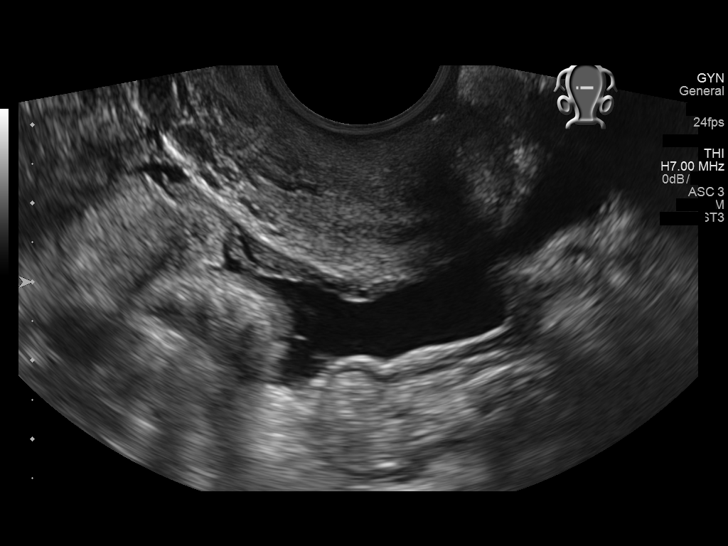
[im 61/121]
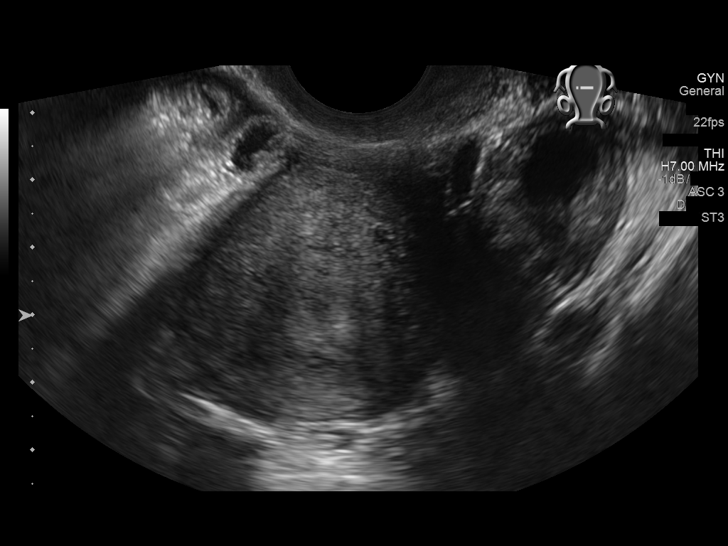
[im 71/121]
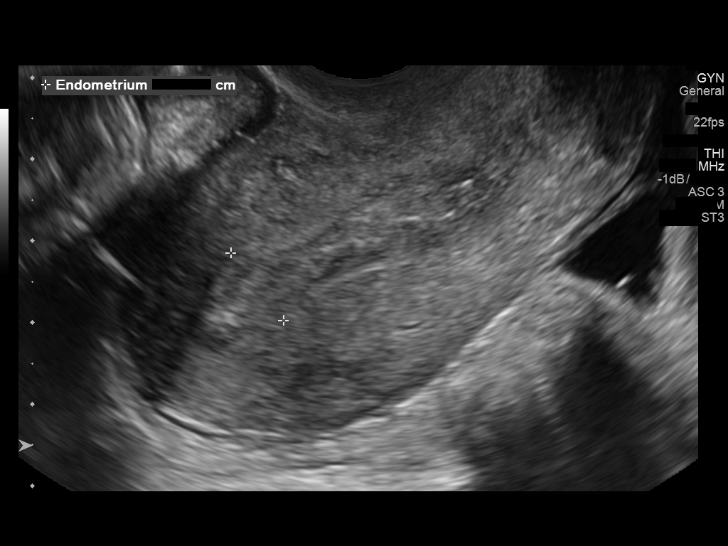
[im 81/121]
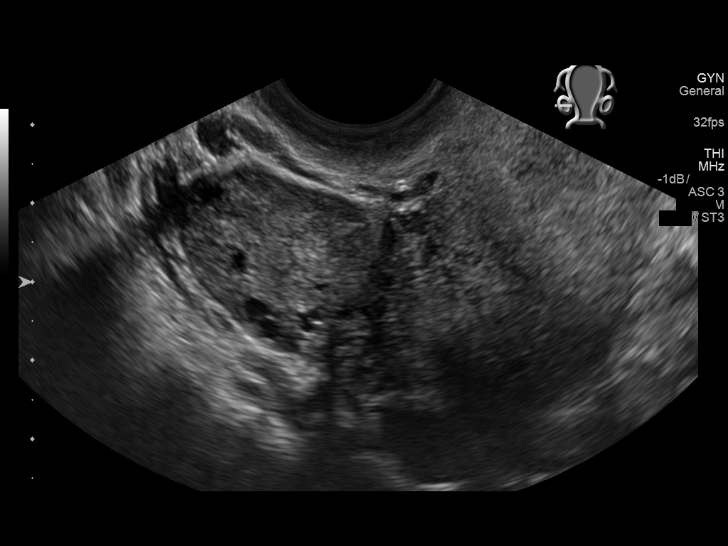
[im 91/121]
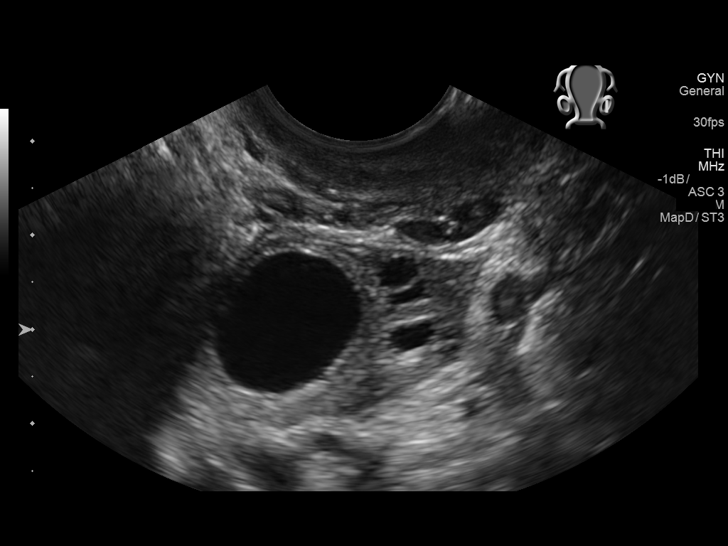
[im 101/121]
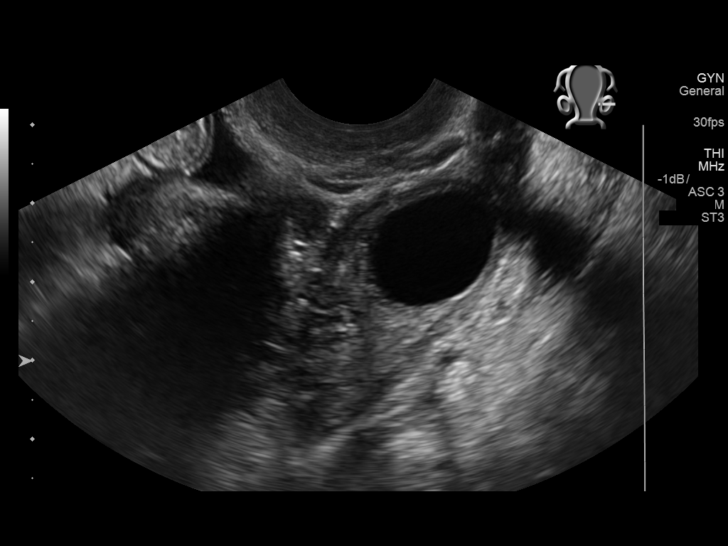
[im 111/121]
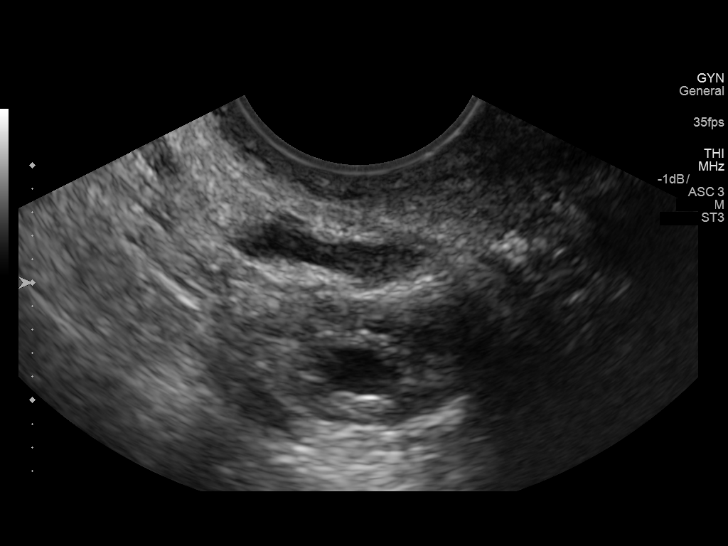
[im 121/121]
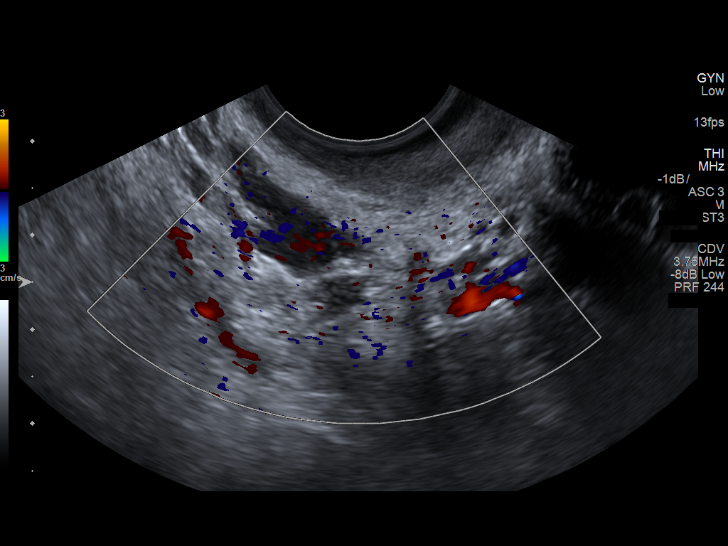

[13 of 25 positions shown; findings below may reference images not displayed]

FINDINGS: Uterus

Measurements: 7.6 x 3.3 x 4.5 cm. No fibroids or other mass
visualized.

Endometrium

Thickness: 11 mm.  No focal abnormality visualized.

Right ovary

Measurements: 0.3 x 2.2 x 2.6 cm. Normal appearance/no adnexal mass.

Left ovary

Measurements: 3.6 x 2.3 x 2.7 cm. 1.7 x 1.4 x 1.7 cm simple cyst,
most consistent with a normal physiologic follicular cyst/dominant
follicle.

Other findings

Small volume free physiologic fluid within the pelvis.
IMPRESSION: 1. 1.7 cm simple left ovarian cyst, most consistent with a normal
physiologic follicular cyst/dominant follicle. Associated small
volume free physiologic fluid within the pelvis.
2. Otherwise unremarkable and normal pelvic ultrasound.

## 2020-07-04 ENCOUNTER — Ambulatory Visit (INDEPENDENT_AMBULATORY_CARE_PROVIDER_SITE_OTHER): Payer: Self-pay

## 2020-07-04 ENCOUNTER — Other Ambulatory Visit: Payer: Self-pay

## 2020-07-04 ENCOUNTER — Other Ambulatory Visit (HOSPITAL_COMMUNITY)
Admission: RE | Admit: 2020-07-04 | Discharge: 2020-07-04 | Disposition: A | Discharge status: Home / Self Care | Payer: Medicaid Other | Source: Ambulatory Visit | Attending: Family Medicine | Admitting: Family Medicine

## 2020-07-04 VITALS — BP 114/76 | HR 89

## 2020-07-04 DIAGNOSIS — N898 Other specified noninflammatory disorders of vagina: Secondary | ICD-10-CM | POA: Insufficient documentation

## 2020-07-04 NOTE — Progress Notes (Signed)
Chart reviewed for nurse visit. Agree with plan of care.   Venora Maples, MD 07/04/20 8:48 PM

## 2020-07-04 NOTE — Progress Notes (Signed)
Pt here today for STD Screening due to report of partner being exposed to chlamydia.  Pt explained how to obtain self swab.  Pt advised that we will call in 24-48 with abnormal results.  Pt verbalized understanding.  [name]  07/04/20

## 2020-07-05 ENCOUNTER — Telehealth: Payer: Self-pay | Admitting: Family Medicine

## 2020-07-05 DIAGNOSIS — B3731 Acute candidiasis of vulva and vagina: Secondary | ICD-10-CM

## 2020-07-05 DIAGNOSIS — N760 Acute vaginitis: Secondary | ICD-10-CM

## 2020-07-05 DIAGNOSIS — B9689 Other specified bacterial agents as the cause of diseases classified elsewhere: Secondary | ICD-10-CM

## 2020-07-05 DIAGNOSIS — B373 Candidiasis of vulva and vagina: Secondary | ICD-10-CM

## 2020-07-05 DIAGNOSIS — A749 Chlamydial infection, unspecified: Secondary | ICD-10-CM

## 2020-07-05 LAB — CERVICOVAGINAL ANCILLARY ONLY
Bacterial Vaginitis (gardnerella): POSITIVE — AB
Candida Glabrata: NEGATIVE
Candida Vaginitis: POSITIVE — AB
Chlamydia: POSITIVE — AB
Comment: NEGATIVE
Comment: NEGATIVE
Comment: NEGATIVE
Comment: NEGATIVE
Comment: NEGATIVE
Comment: NORMAL
Neisseria Gonorrhea: NEGATIVE
Trichomonas: NEGATIVE

## 2020-07-05 MED ORDER — METRONIDAZOLE 500 MG PO TABS: 500.0000 mg | ORAL_TABLET | Freq: Two times a day (BID) | ORAL | 0 refills | Status: AC

## 2020-07-05 MED ORDER — FLUCONAZOLE 150 MG PO TABS: 150.0000 mg | ORAL_TABLET | Freq: Once | ORAL | 0 refills | Status: AC

## 2020-07-05 MED ORDER — AZITHROMYCIN 250 MG PO TABS: 1000.0000 mg | ORAL_TABLET | Freq: Once | ORAL | 0 refills | Status: AC

## 2020-07-05 NOTE — Telephone Encounter (Signed)
Called pt and informed her of test results which are positive for Chlamydia, BV and Candida. All prescriptions have been sent to her pharmacy. Pt was advised that her partner will require treatment for Chlamydia and can receive the treatment @ [facility]. She must refrain from sexual intercourse until 2 weeks after they both have been treated. Pt was advised to take the Fluconazole tablet after she has completed Azithromycin & Metronidazole. Pt voiced understanding.

## 2020-07-05 NOTE — Telephone Encounter (Signed)
Please call patient and let her know she has chlamydia, BV, and candida. I have sent rx to her pharmacy on file.

## 2020-07-23 ENCOUNTER — Ambulatory Visit (INDEPENDENT_AMBULATORY_CARE_PROVIDER_SITE_OTHER): Payer: Self-pay | Admitting: Obstetrics & Gynecology

## 2020-07-23 ENCOUNTER — Other Ambulatory Visit: Payer: Self-pay

## 2020-07-23 VITALS — BP 116/76 | HR 76 | Ht 64.0 in | Wt 151.5 lb

## 2020-07-23 DIAGNOSIS — Z3046 Encounter for surveillance of implantable subdermal contraceptive: Secondary | ICD-10-CM

## 2020-07-23 NOTE — Patient Instructions (Signed)
Etonogestrel implant What is this medicine? ETONOGESTREL (et oh noe JES trel) is a contraceptive (birth control) device. It is used to prevent pregnancy. It can be used for up to 3 years. This medicine may be used for other purposes; ask your health care provider or pharmacist if you have questions. COMMON BRAND NAME(S): Implanon, Nexplanon What should I tell my health care provider before I take this medicine? They need to know if you have any of these conditions:  abnormal vaginal bleeding  blood vessel disease or blood clots  breast, cervical, endometrial, ovarian, liver, or uterine cancer  diabetes  gallbladder disease  heart disease or recent heart attack  high blood pressure  high cholesterol or triglycerides  kidney disease  liver disease  migraine headaches  seizures  stroke  tobacco smoker  an unusual or allergic reaction to etonogestrel, anesthetics or antiseptics, other medicines, foods, dyes, or preservatives  pregnant or trying to get pregnant  breast-feeding How should I use this medicine? This device is inserted just under the skin on the inner side of your upper arm by a health care professional. Talk to your pediatrician regarding the use of this medicine in children. Special care may be needed. Overdosage: If you think you have taken too much of this medicine contact a poison control center or emergency room at once. NOTE: This medicine is only for you. Do not share this medicine with others. What if I miss a dose? This does not apply. What may interact with this medicine? Do not take this medicine with any of the following medications:  amprenavir  fosamprenavir This medicine may also interact with the following medications:  acitretin  aprepitant  armodafinil  bexarotene  bosentan  carbamazepine  certain medicines for fungal infections like fluconazole, ketoconazole, itraconazole and voriconazole  certain medicines to treat  hepatitis, HIV or AIDS  cyclosporine  felbamate  griseofulvin  lamotrigine  modafinil  oxcarbazepine  phenobarbital  phenytoin  primidone  rifabutin  rifampin  rifapentine  St. John's wort  topiramate This list may not describe all possible interactions. Give your health care provider a list of all the medicines, herbs, non-prescription drugs, or dietary supplements you use. Also tell them if you smoke, drink alcohol, or use illegal drugs. Some items may interact with your medicine. What should I watch for while using this medicine? This product does not protect you against HIV infection (AIDS) or other sexually transmitted diseases. You should be able to feel the implant by pressing your fingertips over the skin where it was inserted. Contact your doctor if you cannot feel the implant, and use a non-hormonal birth control method (such as condoms) until your doctor confirms that the implant is in place. Contact your doctor if you think that the implant may have broken or become bent while in your arm. You will receive a user card from your health care provider after the implant is inserted. The card is a record of the location of the implant in your upper arm and when it should be removed. Keep this card with your health records. What side effects may I notice from receiving this medicine? Side effects that you should report to your doctor or health care professional as soon as possible:  allergic reactions like skin rash, itching or hives, swelling of the face, lips, or tongue  breast lumps, breast tissue changes, or discharge  breathing problems  changes in emotions or moods  coughing up blood  if you feel that the implant   may have broken or bent while in your arm  high blood pressure  pain, irritation, swelling, or bruising at the insertion site  scar at site of insertion  signs of infection at the insertion site such as fever, and skin redness, pain or  discharge  signs and symptoms of a blood clot such as breathing problems; changes in vision; chest pain; severe, sudden headache; pain, swelling, warmth in the leg; trouble speaking; sudden numbness or weakness of the face, arm or leg  signs and symptoms of liver injury like dark yellow or brown urine; general ill feeling or flu-like symptoms; light-colored stools; loss of appetite; nausea; right upper belly pain; unusually weak or tired; yellowing of the eyes or skin  unusual vaginal bleeding, discharge Side effects that usually do not require medical attention (report to your doctor or health care professional if they continue or are bothersome):  acne  breast pain or tenderness  headache  irregular menstrual bleeding  nausea This list may not describe all possible side effects. Call your doctor for medical advice about side effects. You may report side effects to FDA at 1-800-FDA-1088. Where should I keep my medicine? This drug is given in a hospital or clinic and will not be stored at home. NOTE: This sheet is a summary. It may not cover all possible information. If you have questions about this medicine, talk to your doctor, pharmacist, or health care provider.  2021 Elsevier/Gold Standard (2019-02-08 11:33:04)  

## 2020-07-23 NOTE — Progress Notes (Signed)
GYNECOLOGY OFFICE PROCEDURE NOTE  Christine Howe is a 25 y.o. G0P0000 here for Nexplanon removal.    Nexplanon Removal Patient identified, informed consent performed, consent signed.   Appropriate time out taken. Nexplanon site identified.  Area prepped in usual sterile fashon. 1.5 ml of 1% lidocaine was used to anesthetize the area at the distal end of the implant. A small stab incision was made right beside the implant on the distal portion.  The Nexplanon rod was grasped and removed without difficulty.  There was minimal blood loss. There were no complications. Steri-strips were applied over the small incision.  A pressure bandage was applied to reduce any bruising.  The patient tolerated the procedure well and was given post procedure instructions.  Patient is planning to use abstinence for contraception.      Adam Phenix, MD Attending Obstetrician & Gynecologist, [facility]  07/23/2020

## 2020-08-08 ENCOUNTER — Encounter: Payer: Self-pay | Admitting: General Practice

## 2022-04-22 ENCOUNTER — Other Ambulatory Visit: Payer: Self-pay

## 2022-04-22 ENCOUNTER — Encounter (HOSPITAL_COMMUNITY): Payer: Self-pay | Admitting: Emergency Medicine

## 2022-04-22 ENCOUNTER — Emergency Department (HOSPITAL_COMMUNITY)
Admission: EM | Admit: 2022-04-22 | Discharge: 2022-04-22 | Disposition: A | Discharge status: Home / Self Care | Payer: Medicaid Other | Attending: Emergency Medicine | Admitting: Emergency Medicine

## 2022-04-22 DIAGNOSIS — B9689 Other specified bacterial agents as the cause of diseases classified elsewhere: Secondary | ICD-10-CM | POA: Insufficient documentation

## 2022-04-22 DIAGNOSIS — S1012XA Blister (nonthermal) of throat, initial encounter: Secondary | ICD-10-CM

## 2022-04-22 DIAGNOSIS — N760 Acute vaginitis: Secondary | ICD-10-CM | POA: Insufficient documentation

## 2022-04-22 DIAGNOSIS — Z1152 Encounter for screening for COVID-19: Secondary | ICD-10-CM | POA: Insufficient documentation

## 2022-04-22 LAB — RESP PANEL BY RT-PCR (RSV, FLU A&B, COVID)  RVPGX2
Influenza A by PCR: NEGATIVE
Influenza B by PCR: NEGATIVE
Resp Syncytial Virus by PCR: NEGATIVE
SARS Coronavirus 2 by RT PCR: NEGATIVE

## 2022-04-22 LAB — URINALYSIS, ROUTINE W REFLEX MICROSCOPIC
Bacteria, UA: NONE SEEN
Bilirubin Urine: NEGATIVE
Glucose, UA: NEGATIVE mg/dL
Hgb urine dipstick: NEGATIVE
Ketones, ur: NEGATIVE mg/dL
Nitrite: NEGATIVE
Protein, ur: NEGATIVE mg/dL
Specific Gravity, Urine: 1.015 (ref 1.005–1.030)
pH: 7 (ref 5.0–8.0)

## 2022-04-22 LAB — WET PREP, GENITAL
Sperm: NONE SEEN
Trich, Wet Prep: NONE SEEN
WBC, Wet Prep HPF POC: <10 (ref <10)
Yeast Wet Prep HPF POC: NONE SEEN

## 2022-04-22 LAB — RAPID HIV SCREEN (HIV 1/2 AB+AG)
HIV 1/2 Antibodies: NONREACTIVE
HIV-1 P24 Antigen - HIV24: NONREACTIVE

## 2022-04-22 LAB — GROUP A STREP BY PCR: Group A Strep by PCR: NOT DETECTED

## 2022-04-22 MED ORDER — METRONIDAZOLE 500 MG PO TABS: 500.0000 mg | ORAL_TABLET | Freq: Two times a day (BID) | ORAL | 0 refills | Status: DC

## 2022-04-22 NOTE — ED Provider Triage Note (Signed)
Emergency Medicine Provider Triage Evaluation Note  Christine Howe , a 26 y.o. female  was evaluated in triage.  Pt complains of sore throat.  Patient reports symptoms began 2 weeks ago.  Patient reports that she had recent oral sex with her boyfriend who she is monogamous with however states that she had sore throat prior to oral sex.  Patient reports that she is taken TheraFlu however has not relieved symptoms.  The patient is concerned about "blisters" on her throat however I do not appreciate any blisters on visual inspection.  Patient revealed midline, handling secretions appropriately.  Patient denies fevers, nausea, vomiting.  Patient does endorse slight cough.  Denies known sick contacts.  Review of Systems  Positive:  Negative:   Physical Exam  BP 124/70 (BP Location: Right Arm)   Pulse 83   Temp 100.1 F (37.8 C) (Oral)   Resp 16   SpO2 100%  Gen:   Awake, no distress   Resp:  Normal effort  MSK:   Moves extremities without difficulty  Other:    Medical Decision Making  Medically screening exam initiated at 6:26 PM.  Appropriate orders placed.  Shellye Zandi was informed that the remainder of the evaluation will be completed by another provider, this initial triage assessment does not replace that evaluation, and the importance of remaining in the ED until their evaluation is complete.     Al Decant, PA-C 04/22/22 1827

## 2022-04-22 NOTE — ED Triage Notes (Signed)
Pt reports sore throat and "bumps on her throat." Pt stating she is also having vaginal d/c "on and off" Pt reports she is using a new condom but would also like STD testing.

## 2022-04-22 NOTE — ED Provider Notes (Signed)
[facility]-EMERGENCY DEPT Provider Note   CSN: [id number] Arrival date & time: 04/22/22  1737     History  Chief Complaint  Patient presents with   Sore Throat    Christine Howe is a 26 y.o. female, no pertinent past medical history, who presents to the ED secondary to sore throat, and concerns for vaginal discharge.  She states she has not had intermittent vaginal discharge, some irritation down there, after using a new condom with her boyfriend.  She states that she is concerned that she may have an STD, and wants to be evaluated.  Is any burning in the urine, increased frequency or urgency.  Also states that she has some bumps in the back of her throat and wants to be evaluated.  She denies any soreness, irritation to her throat, but does note she has had some congestion.    Home Medications Prior to Admission medications   Medication Sig Start Date End Date Taking? Authorizing Provider  metroNIDAZOLE (FLAGYL) 500 MG tablet Take 1 tablet (500 mg total) by mouth 2 (two) times daily. 04/22/22  Yes Ryley Bachtel L, PA  acetaminophen (TYLENOL) 500 MG tablet Take 1,000 mg by mouth daily as needed for moderate pain.    [provider]  ibuprofen (ADVIL) 800 MG tablet Take 1 tablet (800 mg total) by mouth 3 (three) times daily. 03/17/19   Lorelee New, PA-C  ondansetron (ZOFRAN ODT) 4 MG disintegrating tablet Take 1 tablet (4 mg total) by mouth every 8 (eight) hours as needed for nausea or vomiting. Patient not taking: Reported on 02/15/2018 11/08/17   Dietrich Pates, PA-C      Allergies    Patient has no known allergies.    Review of Systems   Review of Systems  Genitourinary:  Positive for vaginal discharge. Negative for difficulty urinating and dysuria.    Physical Exam Updated Vital Signs BP 124/70 (BP Location: Right Arm)   Pulse 83   Temp 100.1 F (37.8 C) (Oral)   Resp 16   SpO2 100%  Physical Exam Vitals and nursing note reviewed.   Constitutional:      General: She is not in acute distress.    Appearance: She is well-developed.  HENT:     Head: Normocephalic and atraumatic.     Mouth/Throat:     Comments: Mild erythema of oropharynx w/2 Philmore Lepore blisters noted Eyes:     Conjunctiva/sclera: Conjunctivae normal.  Cardiovascular:     Rate and Rhythm: Normal rate and regular rhythm.     Heart sounds: No murmur heard. Pulmonary:     Effort: Pulmonary effort is normal. No respiratory distress.     Breath sounds: Normal breath sounds.  Abdominal:     Palpations: Abdomen is soft.     Tenderness: There is no abdominal tenderness.  Musculoskeletal:        General: No swelling.     Cervical back: Neck supple.  Skin:    General: Skin is warm and dry.     Capillary Refill: Capillary refill takes less than 2 seconds.  Neurological:     Mental Status: She is alert.  Psychiatric:        Mood and Affect: Mood normal.     ED Results / Procedures / Treatments   Labs (all labs ordered are listed, but only abnormal results are displayed) Labs Reviewed  WET PREP, GENITAL - Abnormal; Notable for the following components:      Result Value   Clue  Cells Wet Prep HPF POC PRESENT (*)    All other components within normal limits  URINALYSIS, ROUTINE W REFLEX MICROSCOPIC - Abnormal; Notable for the following components:   APPearance HAZY (*)    Leukocytes,Ua MODERATE (*)    All other components within normal limits  RESP PANEL BY RT-PCR (RSV, FLU A&B, COVID)  RVPGX2  GROUP A STREP BY PCR  RAPID HIV SCREEN (HIV 1/2 AB+AG)  RPR  GC/CHLAMYDIA PROBE AMP ([place]) NOT AT [facility]    EKG None  Radiology No results found.  Procedures Procedures    Medications Ordered in ED Medications - No data to display  ED Course/ Medical Decision Making/ A&P                           Medical Decision Making Patient is 26 year old female she is requesting to be tested for STDs, and have her throat looked at.  She denies any  sore throat, she is found to be almost febrile on exam, and only has complaint of congestion.  Believe her throat concern may be strep/COVID ordered by triage.  Will also obtain STD testing.  Amount and/or Complexity of Data Reviewed Labs: ordered.    Details: Positive for clue cells, negative for strep, COVID/flu. Discussion of management or test interpretation with external provider(s): Discussed with patient, importance of follow-up with primary care doctor, and discharged with metronidazole.  She is well-appearing on exam, and is able to eat and drink fluids, open mouth completely, and denies any sore throat.  We discussed importance of follow-up with primary care doctor for HIV, syphilis, GC results.  Risk Prescription drug management.   Final Clinical Impression(s) / ED Diagnoses Final diagnoses:  Bacterial vaginosis  Blister of throat    Rx / DC Orders ED Discharge Orders          Ordered    metroNIDAZOLE (FLAGYL) 500 MG tablet  2 times daily        04/22/22 2159              Osvaldo Shipper, [place] 04/22/22 2235    Lacretia Leigh, MD 04/25/22 1549

## 2022-04-22 NOTE — Discharge Instructions (Addendum)
Please follow-up with your primary care doctor, take your antibiotic completely.  Your testing for HIV, syphilis, gonorrhea and chlamydia will come back in the next few days.  You will need to follow-up with your primary care doctor for those results.  You not drink alcohol with the Flagyl or you will vomit.

## 2022-04-22 NOTE — ED Notes (Signed)
An After Visit Summary was printed and given to the patient. Discharge instructions given and no further questions at this time.  

## 2022-04-23 LAB — GC/CHLAMYDIA PROBE AMP (~~LOC~~) NOT AT ARMC
Chlamydia: NEGATIVE
Comment: NEGATIVE
Comment: NORMAL
Neisseria Gonorrhea: NEGATIVE

## 2022-04-23 LAB — RPR: RPR Ser Ql: NONREACTIVE

## 2022-08-14 ENCOUNTER — Ambulatory Visit: Payer: Medicaid Other

## 2022-08-14 DIAGNOSIS — Z3200 Encounter for pregnancy test, result unknown: Secondary | ICD-10-CM

## 2022-08-14 DIAGNOSIS — Z3202 Encounter for pregnancy test, result negative: Secondary | ICD-10-CM

## 2022-08-14 LAB — POCT PREGNANCY, URINE: Preg Test, Ur: NEGATIVE

## 2022-08-14 NOTE — Progress Notes (Signed)
Possible Pregnancy  Here today for pregnancy confirmation. UPT in office today is negative. Pt reports first positive home UPT on 08/10/21; has had 3 positive tests since. Will return in 1 week first thing in AM for repeat urine test.  Annabell Howells, RN 08/14/2022  2:39 PM

## 2022-08-21 ENCOUNTER — Other Ambulatory Visit: Payer: Self-pay

## 2022-08-21 ENCOUNTER — Ambulatory Visit: Payer: Medicaid Other

## 2022-08-21 DIAGNOSIS — Z3201 Encounter for pregnancy test, result positive: Secondary | ICD-10-CM

## 2022-08-21 DIAGNOSIS — Z3200 Encounter for pregnancy test, result unknown: Secondary | ICD-10-CM

## 2022-08-21 DIAGNOSIS — O3680X0 Pregnancy with inconclusive fetal viability, not applicable or unspecified: Secondary | ICD-10-CM

## 2022-08-21 LAB — POCT PREGNANCY, URINE: Preg Test, Ur: POSITIVE — AB

## 2022-08-21 NOTE — Progress Notes (Signed)
Tried calling patient x 2 and left VM to return call to office for results.  Judeth Cornfield, RNC

## 2022-08-21 NOTE — Progress Notes (Signed)
Pt dropped off urine today for UPT. UPT is positive. Pt had UPT last week in office on 4/4 that was negative. Pt states took last pregnancy test at home on 4/1. Pt states had spotting 2 days ago while you wiped that was light pink. Pt only having mild cramps. Since patient has unknown pregnancy location and not sure of LMP, pt advised to have dating [place]. Pt scheduled for 4/23 at 930 am. Pt verbalized understanding and plan of care. [place] orders placed.   LMP: ?06/12/2022  Judeth Cornfield, RN

## 2022-08-27 ENCOUNTER — Encounter (HOSPITAL_COMMUNITY): Payer: Self-pay | Admitting: *Deleted

## 2022-08-27 ENCOUNTER — Other Ambulatory Visit: Payer: Self-pay

## 2022-08-27 ENCOUNTER — Inpatient Hospital Stay (HOSPITAL_COMMUNITY): Payer: Medicaid Other

## 2022-08-27 ENCOUNTER — Inpatient Hospital Stay (HOSPITAL_COMMUNITY)
Admission: AD | Admit: 2022-08-27 | Discharge: 2022-08-27 | Disposition: A | Discharge status: Home / Self Care | Payer: Medicaid Other | Attending: Obstetrics and Gynecology | Admitting: Obstetrics and Gynecology

## 2022-08-27 DIAGNOSIS — N939 Abnormal uterine and vaginal bleeding, unspecified: Secondary | ICD-10-CM

## 2022-08-27 DIAGNOSIS — O3680X0 Pregnancy with inconclusive fetal viability, not applicable or unspecified: Secondary | ICD-10-CM | POA: Diagnosis not present

## 2022-08-27 DIAGNOSIS — O0281 Inappropriate change in quantitative human chorionic gonadotropin (hCG) in early pregnancy: Secondary | ICD-10-CM

## 2022-08-27 DIAGNOSIS — O209 Hemorrhage in early pregnancy, unspecified: Secondary | ICD-10-CM | POA: Diagnosis present

## 2022-08-27 DIAGNOSIS — Z3A01 Less than 8 weeks gestation of pregnancy: Secondary | ICD-10-CM | POA: Insufficient documentation

## 2022-08-27 DIAGNOSIS — B379 Candidiasis, unspecified: Secondary | ICD-10-CM | POA: Diagnosis not present

## 2022-08-27 DIAGNOSIS — B9689 Other specified bacterial agents as the cause of diseases classified elsewhere: Secondary | ICD-10-CM | POA: Insufficient documentation

## 2022-08-27 DIAGNOSIS — N760 Acute vaginitis: Secondary | ICD-10-CM

## 2022-08-27 DIAGNOSIS — O23591 Infection of other part of genital tract in pregnancy, first trimester: Secondary | ICD-10-CM | POA: Diagnosis not present

## 2022-08-27 LAB — CBC
HCT: 36.3 % (ref 36.0–46.0)
Hemoglobin: 11.5 g/dL — ABNORMAL LOW (ref 12.0–15.0)
MCH: 29.9 pg (ref 26.0–34.0)
MCHC: 31.7 g/dL (ref 30.0–36.0)
MCV: 94.5 fL (ref 80.0–100.0)
Platelets: 387 10*3/uL (ref 150–400)
RBC: 3.84 MIL/uL — ABNORMAL LOW (ref 3.87–5.11)
RDW: 12.3 % (ref 11.5–15.5)
WBC: 7.1 10*3/uL (ref 4.0–10.5)
nRBC: 0 % (ref 0.0–0.2)

## 2022-08-27 LAB — URINALYSIS, ROUTINE W REFLEX MICROSCOPIC
Bacteria, UA: NONE SEEN
Bilirubin Urine: NEGATIVE
Glucose, UA: NEGATIVE mg/dL
Ketones, ur: NEGATIVE mg/dL
Leukocytes,Ua: NEGATIVE
Nitrite: NEGATIVE
Protein, ur: NEGATIVE mg/dL
Specific Gravity, Urine: 1.024 (ref 1.005–1.030)
pH: 7 (ref 5.0–8.0)

## 2022-08-27 LAB — WET PREP, GENITAL
Sperm: NONE SEEN
Trich, Wet Prep: NONE SEEN
WBC, Wet Prep HPF POC: <10 (ref <10)
Yeast Wet Prep HPF POC: NONE SEEN

## 2022-08-27 LAB — ABO/RH: ABO/RH(D): O POS

## 2022-08-27 LAB — HCG, QUANTITATIVE, PREGNANCY: hCG, Beta Chain, Quant, S: 956 m[IU]/mL — ABNORMAL HIGH (ref <5)

## 2022-08-27 MED ORDER — METRONIDAZOLE 500 MG PO TABS: 500.0000 mg | ORAL_TABLET | Freq: Two times a day (BID) | ORAL | 0 refills | Status: AC

## 2022-08-27 MED ORDER — TERCONAZOLE 0.4 % VA CREA: 1.0000 | TOPICAL_CREAM | Freq: Every day | VAGINAL | 0 refills | Status: DC

## 2022-08-27 NOTE — MAU Provider Note (Signed)
History     CSN: [id number]  Arrival date and time: 08/27/22 1518   Event Date/Time   First Provider Initiated Contact with Patient 08/27/22 1930      Chief Complaint  Patient presents with   Vaginal Bleeding   Christine Howe , a  27 y.o. G1P0000 at [redacted]w[redacted]d presents to MAU with complaints of vaginal spotting and bleeding. She states spotting started this morning at 8:23 AM. Reports that it was light brown/pink. She states that later this afternoon she reports bright red blood dripping in the toilet and became concerned and reported to MAU. She states she wore a pad in but states that it was clean. She denies passing clots. She denies abnormal vaginal discharge prior to event and denies pain and urinary symptoms.          OB History     Gravida  1   Para  0   Term  0   Preterm  0   AB  0   Living  0      SAB  0   IAB  0   Ectopic  0   Multiple  0   Live Births  0           No past medical history on file.  Past Surgical History:  Procedure Laterality Date   WISDOM TOOTH EXTRACTION      No family history on file.  Social History   Tobacco Use   Smoking status: Never   Smokeless tobacco: Never  Substance Use Topics   Alcohol use: No   Drug use: No    Allergies: No Known Allergies  Medications Prior to Admission  Medication Sig Dispense Refill Last Dose   acetaminophen (TYLENOL) 500 MG tablet Take 1,000 mg by mouth daily as needed for moderate pain. (Patient not taking: Reported on 08/21/2022)      ibuprofen (ADVIL) 800 MG tablet Take 1 tablet (800 mg total) by mouth 3 (three) times daily. (Patient not taking: Reported on 08/21/2022) 21 tablet 0    ondansetron (ZOFRAN ODT) 4 MG disintegrating tablet Take 1 tablet (4 mg total) by mouth every 8 (eight) hours as needed for nausea or vomiting. (Patient not taking: Reported on 08/21/2022) 3 tablet 0     Review of Systems  Constitutional:  Negative for chills, fatigue and fever.  Eyes:  Negative for  pain and visual disturbance.  Respiratory:  Negative for apnea, shortness of breath and wheezing.   Cardiovascular:  Negative for chest pain and palpitations.  Gastrointestinal:  Negative for abdominal pain, constipation, diarrhea, nausea and vomiting.  Genitourinary:  Positive for vaginal bleeding. Negative for difficulty urinating, dysuria, pelvic pain, vaginal discharge and vaginal pain.  Musculoskeletal:  Negative for back pain.  Neurological:  Negative for seizures, weakness and headaches.  Psychiatric/Behavioral:  Negative for suicidal ideas.    Physical Exam   Blood pressure 107/66, pulse 79, temperature 98.6 F (37 C), temperature source Oral, resp. rate 17, height  (1.6 m), weight 67.8 kg, last menstrual period 07/15/2022, SpO2 98 %.  Physical Exam Vitals and nursing note reviewed.  Constitutional:      General: She is not in acute distress.    Appearance: Normal appearance.  HENT:     Head: Normocephalic.  Pulmonary:     Effort: Pulmonary effort is normal.  Musculoskeletal:     Cervical back: Normal range of motion.  Skin:    General: Skin is warm and dry.  Neurological:  Mental Status: She is alert and oriented to person, place, and time.  Psychiatric:        Mood and Affect: Mood normal.     MAU Course  Procedures Orders Placed This Encounter  Procedures   Wet prep, genital   US OB LESS THAN 14 WEEKS WITH OB TRANSVAGINAL   Urinalysis, Routine w reflex microscopic -Urine, Clean Catch   CBC   hCG, quantitative, pregnancy   Diet NPO time specified   ABO/Rh   Discharge patient   Results for orders placed or performed during the hospital encounter of 08/27/22 (from the past 24 hour(s))  Urinalysis, Routine w reflex microscopic -Urine, Clean Catch     Status: Abnormal   Collection Time: 08/27/22  4:25 PM  Result Value Ref Range   Color, Urine YELLOW YELLOW   APPearance HAZY (A) CLEAR   Specific Gravity, Urine 1.024 1.005 - 1.030   pH 7.0 5.0 - 8.0    Glucose, UA NEGATIVE NEGATIVE mg/dL   Hgb urine dipstick LARGE (A) NEGATIVE   Bilirubin Urine NEGATIVE NEGATIVE   Ketones, ur NEGATIVE NEGATIVE mg/dL   Protein, ur NEGATIVE NEGATIVE mg/dL   Nitrite NEGATIVE NEGATIVE   Leukocytes,Ua NEGATIVE NEGATIVE   RBC / HPF 0-5 0 - 5 RBC/hpf   WBC, UA 0-5 0 - 5 WBC/hpf   Bacteria, UA NONE SEEN NONE SEEN   Squamous Epithelial / HPF 6-10 0 - 5 /HPF   Mucus PRESENT   Wet prep, genital     Status: Abnormal   Collection Time: 08/27/22  4:26 PM   Specimen: PATH Cytology Cervicovaginal Ancillary Only  Result Value Ref Range   Yeast Wet Prep HPF POC NONE SEEN NONE SEEN   Trich, Wet Prep NONE SEEN NONE SEEN   Clue Cells Wet Prep HPF POC PRESENT (A) NONE SEEN   WBC, Wet Prep HPF POC <10 <10   Sperm NONE SEEN   CBC     Status: Abnormal   Collection Time: 08/27/22  4:36 PM  Result Value Ref Range   WBC 7.1 4.0 - 10.5 K/uL   RBC 3.84 (L) 3.87 - 5.11 MIL/uL   Hemoglobin 11.5 (L) 12.0 - 15.0 g/dL   HCT 16.1 09.6 - 04.5 %   MCV 94.5 80.0 - 100.0 fL   MCH 29.9 26.0 - 34.0 pg   MCHC 31.7 30.0 - 36.0 g/dL   RDW 40.9 81.1 - 91.4 %   Platelets 387 150 - 400 K/uL   nRBC 0.0 0.0 - 0.2 %  ABO/Rh     Status: None   Collection Time: 08/27/22  4:36 PM  Result Value Ref Range   ABO/RH(D) O POS    No rh immune globuloin      NOT A RH IMMUNE GLOBULIN CANDIDATE, PT RH POSITIVE Performed at [facility], [address] [place], [place] [address]   hCG, quantitative, pregnancy     Status: Abnormal   Collection Time: 08/27/22  4:36 PM  Result Value Ref Range   hCG, Beta Chain, Quant, S 956 (H) <5 mIU/mL   US OB LESS THAN 14 WEEKS WITH OB TRANSVAGINAL  Result Date: 08/27/2022 CLINICAL DATA:  Vaginal bleeding. Estimated gestational age by last menstrual period equals 6 weeks 1 day EXAM: OBSTETRIC <14 WK [place] AND TRANSVAGINAL OB US TECHNIQUE: Both transabdominal and transvaginal ultrasound examinations were performed for complete evaluation of the gestation  as well as the maternal uterus, adnexal regions, and pelvic cul-de-sac. Transvaginal technique was performed to  assess early pregnancy. COMPARISON:  None Available. FINDINGS: Intrauterine gestational sac: Not identified Yolk sac:  Not identified Embryo:  Not identified Uterus and ovaries normal. Corpus luteal cyst of the LEFT ovary. Trace free fluid. IMPRESSION: No intrauterine gestational sac, yolk sac, or fetal pole identified. Differential considerations include intrauterine pregnancy too early to be sonographically visualized, missed abortion, or ectopic pregnancy. Followup ultrasound is recommended in 10-14 days for further evaluation. Electronically Signed   By: Genevive Bi M.D.   On: 08/27/2022 17:53    MDM - Quant 956  - Wet prep positive for BV  - UA hazy and blood noted in urine, but otherwise normal  - GC pending upon discharge  - [place] results showed no IUP. Diagnostic for pregnancy of unknown location. Recommendation to repeat quant in 48 hours.  - Plan for discharge.   Assessment and Plan   1. Vaginal bleeding   2. Pregnancy of unknown anatomic location   3. [redacted] weeks gestation of pregnancy   4. Elevated level of quantitative human chorionic gonadotropin (hCG) for gestational age in early pregnancy   5. BV (bacterial vaginosis)    - Reviewed results of pregnancy of unknown location and the recommendation to follow up with a repeat quant in 48 hours. Patient agreeable to plan of care and scheduled for repeat quant at [facility] on Friday 08/29/22.  - Also reviewed that certain vaginal infections like BV can cause vaginal spotting in early pregnancy.  - Rx for Metronidazole sent to outpatient pharmacy for pick up.  - Worsening signs and return precautions reviewed.  - Patient discharged home in stable condition and may return to MAU as needed.   Claudette Head, MSN CNM  08/27/2022, 7:30 PM

## 2022-08-27 NOTE — MAU Note (Signed)
Christine Howe is a 27 y.o. at Unknown here in MAU reporting: she began having VB that began today, not saturating a pad. Denies pain, reports "a little cramping".   Denies recent intercourse. LMP: 07/15/2022 Onset of complaint: today Pain score: 1 Vitals:   08/27/22 1544  BP: 107/66  Pulse: 79  Resp: 17  Temp: 98.6 F (37 C)  SpO2: 98%     FHT:NA Lab orders placed from triage:   UA

## 2022-08-28 LAB — GC/CHLAMYDIA PROBE AMP (~~LOC~~) NOT AT ARMC
Chlamydia: NEGATIVE
Comment: NEGATIVE
Comment: NORMAL
Neisseria Gonorrhea: NEGATIVE

## 2022-08-29 ENCOUNTER — Ambulatory Visit (INDEPENDENT_AMBULATORY_CARE_PROVIDER_SITE_OTHER): Payer: Self-pay | Admitting: *Deleted

## 2022-08-29 ENCOUNTER — Other Ambulatory Visit: Payer: Self-pay

## 2022-08-29 VITALS — BP 115/70 | HR 85 | Ht 63.0 in | Wt 149.0 lb

## 2022-08-29 DIAGNOSIS — Z3A01 Less than 8 weeks gestation of pregnancy: Secondary | ICD-10-CM

## 2022-08-29 DIAGNOSIS — O3680X0 Pregnancy with inconclusive fetal viability, not applicable or unspecified: Secondary | ICD-10-CM

## 2022-08-29 LAB — BETA HCG QUANT (REF LAB): hCG Quant: 345 m[IU]/mL

## 2022-08-29 NOTE — Progress Notes (Signed)
Pt presents for stat BHCG. She reports the same amount of bleeding as when she was seen @ MAU on 4/17 - not enough to fill up a pad. Yesterday she had strong abdominal cramps - pain scale 6. Today she denies cramping. Stat BHCG drawn. Pt was advised that she will be called with results later today and next steps in care. She stated that a detailed message can be left on voicemail if she does not answer. Meanwhile she should return to hospital if she develops heavy vaginal bleeding or severe abdominal cramping. Pt voiced understanding.

## 2022-09-02 ENCOUNTER — Other Ambulatory Visit: Payer: Self-pay

## 2022-09-03 ENCOUNTER — Telehealth: Payer: Self-pay

## 2022-09-03 DIAGNOSIS — O039 Complete or unspecified spontaneous abortion without complication: Secondary | ICD-10-CM

## 2022-09-03 NOTE — Telephone Encounter (Addendum)
-----   Message from Tereso Newcomer, MD sent at 09/01/2022  5:14 PM EDT ----- Needs weekly HCG trending until negative Please call to inform patient of results and recommendations.  Pt notified of results and the need to come in for weekly levels.  Pt agreed to 09/05/22 and 09/12/22 to trend levels to zero.   [name]

## 2022-09-05 ENCOUNTER — Other Ambulatory Visit: Payer: Self-pay

## 2022-09-05 DIAGNOSIS — O039 Complete or unspecified spontaneous abortion without complication: Secondary | ICD-10-CM

## 2022-09-05 DIAGNOSIS — Z114 Encounter for screening for human immunodeficiency virus [HIV]: Secondary | ICD-10-CM

## 2022-09-05 NOTE — Progress Notes (Signed)
Patient request if she can get HIV screening at her next HCG draw.  Orders placed for future.   Felecia Shelling CMA

## 2022-09-06 LAB — BETA HCG QUANT (REF LAB): hCG Quant: 65 m[IU]/mL

## 2022-09-08 ENCOUNTER — Telehealth: Payer: Self-pay | Admitting: Lactation Services

## 2022-09-08 NOTE — Telephone Encounter (Signed)
Called patient to inform on Beta Hcg and recommendations for follow up. Patient did not answer. Will send My Chart message to patient.

## 2022-09-08 NOTE — Telephone Encounter (Signed)
-----   Message from Tereso Newcomer, MD sent at 09/06/2022 12:50 PM EDT ----- Continue weekly checks until negative

## 2022-09-12 ENCOUNTER — Other Ambulatory Visit: Payer: Self-pay
